# Patient Record
Sex: Female | Born: 1965 | Race: White | Hispanic: No | Marital: Married | Smoking: Never smoker
Health system: Southern US, Community
[De-identification: ages and names within clinical notes are randomized; demographics above are authoritative.]

## PROBLEM LIST (undated history)

## (undated) DIAGNOSIS — D649 Anemia, unspecified: Secondary | ICD-10-CM

## (undated) DIAGNOSIS — C801 Malignant (primary) neoplasm, unspecified: Secondary | ICD-10-CM

## (undated) DIAGNOSIS — L409 Psoriasis, unspecified: Secondary | ICD-10-CM

## (undated) DIAGNOSIS — M1990 Unspecified osteoarthritis, unspecified site: Secondary | ICD-10-CM

## (undated) HISTORY — PX: APPENDECTOMY: SHX54

---

## 1998-04-27 ENCOUNTER — Ambulatory Visit (HOSPITAL_COMMUNITY): Admission: RE | Admit: 1998-04-27 | Discharge: 1998-04-27 | Payer: Self-pay | Admitting: Obstetrics and Gynecology

## 1998-07-23 ENCOUNTER — Encounter (HOSPITAL_COMMUNITY): Admission: RE | Admit: 1998-07-23 | Discharge: 1998-07-27 | Payer: Self-pay | Admitting: Obstetrics and Gynecology

## 1998-07-26 ENCOUNTER — Inpatient Hospital Stay (HOSPITAL_COMMUNITY): Admission: AD | Admit: 1998-07-26 | Discharge: 1998-07-28 | Payer: Self-pay | Admitting: Obstetrics and Gynecology

## 1998-08-01 ENCOUNTER — Encounter (HOSPITAL_COMMUNITY): Admission: RE | Admit: 1998-08-01 | Discharge: 1998-10-30 | Payer: Self-pay | Admitting: Obstetrics and Gynecology

## 1999-09-13 ENCOUNTER — Ambulatory Visit (HOSPITAL_COMMUNITY): Admission: RE | Admit: 1999-09-13 | Discharge: 1999-09-13 | Payer: Self-pay | Admitting: Obstetrics and Gynecology

## 1999-09-13 ENCOUNTER — Encounter: Payer: Self-pay | Admitting: Obstetrics and Gynecology

## 1999-09-17 ENCOUNTER — Encounter: Payer: Self-pay | Admitting: *Deleted

## 1999-09-17 ENCOUNTER — Ambulatory Visit (HOSPITAL_COMMUNITY): Admission: RE | Admit: 1999-09-17 | Discharge: 1999-09-17 | Payer: Self-pay | Admitting: *Deleted

## 2000-02-10 ENCOUNTER — Inpatient Hospital Stay (HOSPITAL_COMMUNITY): Admission: AD | Admit: 2000-02-10 | Discharge: 2000-02-12 | Payer: Self-pay | Admitting: Obstetrics and Gynecology

## 2001-05-28 ENCOUNTER — Encounter (INDEPENDENT_AMBULATORY_CARE_PROVIDER_SITE_OTHER): Payer: Self-pay | Admitting: *Deleted

## 2001-05-28 ENCOUNTER — Encounter: Payer: Self-pay | Admitting: Emergency Medicine

## 2001-05-28 ENCOUNTER — Observation Stay (HOSPITAL_COMMUNITY): Admission: EM | Admit: 2001-05-28 | Discharge: 2001-05-29 | Payer: Self-pay | Admitting: Emergency Medicine

## 2003-05-02 ENCOUNTER — Encounter: Payer: Self-pay | Admitting: Obstetrics and Gynecology

## 2003-05-02 ENCOUNTER — Ambulatory Visit (HOSPITAL_COMMUNITY): Admission: RE | Admit: 2003-05-02 | Discharge: 2003-05-02 | Payer: Self-pay | Admitting: Obstetrics and Gynecology

## 2004-11-27 ENCOUNTER — Encounter: Admission: RE | Admit: 2004-11-27 | Discharge: 2004-12-26 | Payer: Self-pay | Admitting: Internal Medicine

## 2007-06-01 ENCOUNTER — Ambulatory Visit (HOSPITAL_COMMUNITY): Admission: RE | Admit: 2007-06-01 | Discharge: 2007-06-01 | Payer: Self-pay | Admitting: Obstetrics and Gynecology

## 2008-07-07 ENCOUNTER — Ambulatory Visit (HOSPITAL_COMMUNITY): Admission: RE | Admit: 2008-07-07 | Discharge: 2008-07-07 | Payer: Self-pay | Admitting: Obstetrics and Gynecology

## 2009-05-10 ENCOUNTER — Encounter: Admission: RE | Admit: 2009-05-10 | Discharge: 2009-05-10 | Payer: Self-pay | Admitting: Internal Medicine

## 2009-05-17 ENCOUNTER — Encounter: Admission: RE | Admit: 2009-05-17 | Discharge: 2009-05-17 | Payer: Self-pay | Admitting: Internal Medicine

## 2010-12-03 ENCOUNTER — Other Ambulatory Visit (HOSPITAL_COMMUNITY): Payer: Self-pay | Admitting: Obstetrics and Gynecology

## 2010-12-03 DIAGNOSIS — Z1231 Encounter for screening mammogram for malignant neoplasm of breast: Secondary | ICD-10-CM

## 2010-12-11 ENCOUNTER — Ambulatory Visit (HOSPITAL_COMMUNITY)
Admission: RE | Admit: 2010-12-11 | Discharge: 2010-12-11 | Disposition: A | Discharge status: Home / Self Care | Payer: 59 | Source: Ambulatory Visit | Attending: Obstetrics and Gynecology | Admitting: Obstetrics and Gynecology

## 2010-12-11 DIAGNOSIS — Z1231 Encounter for screening mammogram for malignant neoplasm of breast: Secondary | ICD-10-CM

## 2011-01-24 NOTE — Discharge Summary (Signed)
[facility]  Patient:    Makayla King, Makayla King                      MRN: [id number] Adm. Date:  [id number] Disc. Date: [id number] Attending:  Malon Kindle                           Discharge Summary  HISTORY OF PRESENT ILLNESS:   A 45 year old white married female, para 1-0-0-1,  gravida 2, last menstrual period April 28, 1999, [facility] Feb 02, 2000, by dates and February 12, 2000, by ultrasound, admitted in active labor.  Blood group and type O positive with a negative antibody, nonreactive serology, rubella positive, hepatitis B surface antigen negative, HIV not done, GC and Chlamydia negative, triple screen normal, Group B Strep positive, one-hour Glucola 127.  Vaginal ultrasound on July 19, 1999, crown-rump length 3.3 cm, 10 weeks 2 days, [facility]  February 12, 2000.  Repeat ultrasound September 13, 1999, average gestational age [redacted] weeks 6 days, [facility] February 08, 2000.  Nuchal fold was at the upper limit of normal at 5.9 o 6.0 mm.  There were two coroid plexus cysts.  Amnio was done, AFP was normal, chromosomes were 46XY.  The patient began contracting on the morning of admission and came for evaluation.  Cervix was found to be 8 to 9 cm in triage.  ALLERGIES:                    No known drug allergies.  PAST MEDICAL HISTORY:         No operations, usual childhood diseases. Alcohol, tobacco, and drugs none.  FAMILY HISTORY:               Father with high blood pressure and coronary artery disease.  PAST OBSTETRIC HISTORY:       In November of 1999, the patient delivered a 9 pound 3 ounce female vaginally with vacuum extraction.  PHYSICAL EXAMINATION:         VITAL SIGNS: On admission her vital signs were normal.  ABDOMEN: Soft.  Fundal height had been 38 cm on February 07, 2000, fetal heart tones were normal.  PELVIC: Cervix 8 to 9 cm, 100%, and vertex at -1 to 0 station.  HOSPITAL COURSE:              The patient was given IV antibiotics.  The nursing crew  in the maternity admissions unit began her on ampicillin, I would have chosen penicillin, but she did receive the ampicillin.  She requested an epidural. After the epidural, she progressed to full dilatation at 3:12 a.m.  Artificial rupture of membranes produced clear fluid.  During the second stage, meconium stained fluid became apparent.  The patient gradually progressed to showing a grapefuit-sized  caput.  Midline episiotomy done with the patients permission.  Produced delivery in one contraction after the patient had stalled for five contractions.  The infant was female, 9 pounds 5 ounces, Apgars 9 at one minute and 9 at five minutes. Nose and pharynx were suctioned with the DeLee and the bulb with the head on the perineum.  There was a loose nuchal cord.  Dagoberto Ligas, M.D. visualized he cords and no meconium was present.  The placenta was intact.  The uterus was normal.  Midline episiotomy repaired with 3-0 Dexon.  Estimated blood loss about 400 cc.  Postpartum, the patient did quite  well and was ready for discharge on he second postpartum day.  RPR was nonreactive.  Hemoglobin on admission 11.6, hematocrit 32.6, white count 10,100, and platelet count 174,000.  Follow-up hemoglobin 10.0, hematocrit 29.4, white count 9900, and platelet count 145,000.  FINAL DIAGNOSES:              1. Intrauterine pregnancy at 39+ weeks.                               2. Delivered right occipitoanterior.  PROCEDURE:                    Spontaneous vaginal delivery ROA, midline episiotomy and repair.  CONDITION ON DISCHARGE:       Improved.  DISCHARGE INSTRUCTIONS:       Include our regular discharge instruction booklet. She is given a prescription for Tylenol No.3 16 tablets one every four to six hours as needed for pain.  She is to return to the office in six weeks for follow-up examination. DD:  02/12/00 TD:  02/14/00 Job: [id number]

## 2011-01-24 NOTE — Op Note (Signed)
[facility] [place]  Patient:    Makayla King, Makayla King Visit Number: [id number] MRN: [id number]          Service Type: SUR Location: 3W [id number] 01 Attending Physician:  Vikki Ports Dictated by:   Vikki Ports, M.D. Proc. Date: 05/28/01 Admit Date:  05/28/2001                             Operative Report  PREOPERATIVE DIAGNOSIS:  Acute appendicitis.  POSTOPERATIVE DIAGNOSIS:  Acute appendicitis.  PROCEDURE:  Laparoscopic appendectomy.  ANESTHESIA:  General.  SURGEON:  Vikki Ports, M.D.  DESCRIPTION OF PROCEDURE:  The patient was taken to the operating room and placed in the supine position.  After adequate general anesthesia was induced, the abdomen was prepped and draped in the usual sterile fashion.  Using a transverse supraumbilical incision, I dissected down to fascia.  The fascia was opened vertically.  An 0 Vicryl pursestring suture was placed on the fascial defect.  Hasson trocar was placed in the abdomen and the abdomen was insufflated to 15 mmHg with continuous flow carbon dioxide.  The appendix was identified and appeared acutely inflamed.  There was no exudative fluid around it.  A 5 mm trocar was placed on the right abdomen and a blunt 10/11 was placed in the left suprapubic region under direct visualization.  The appendix was grasped and lifted anteriorly.  The mesoappendix was dissected free and divided using the vascular GIA stapling device.  The base of the appendix was easily visualized and divided using the GIA stapling device.  It was placed within the Endocatch bag and removed through the umbilical port.  The right lower quadrant was copiously irrigated.  The abdomen was allowed to deflate. The fascial defects were closed with 0 Vicryl pursestring sutures.  The skin incisions were closed with subcuticular 4-0 Monocryl.  Steri-Strips and sterile dressings were applied.  All incisions were injected using 0.5  Marcaine.  The patient tolerated the procedure well and went to PACU in good condition. Dictated by:   Vikki Ports, M.D. Attending Physician:  Danna Hefty R. DD:  05/28/01 TD:  05/29/01 Job: [id number]

## 2012-11-23 ENCOUNTER — Ambulatory Visit: Payer: 59 | Admitting: Sports Medicine

## 2012-11-30 ENCOUNTER — Ambulatory Visit (INDEPENDENT_AMBULATORY_CARE_PROVIDER_SITE_OTHER): Payer: 59 | Admitting: Sports Medicine

## 2012-11-30 VITALS — BP 100/60 | Ht 62.0 in | Wt 112.0 lb

## 2012-11-30 DIAGNOSIS — M25531 Pain in right wrist: Secondary | ICD-10-CM

## 2012-11-30 DIAGNOSIS — M25539 Pain in unspecified wrist: Secondary | ICD-10-CM

## 2012-11-30 NOTE — Progress Notes (Signed)
  Subjective:    Patient ID: Makayla King, female    DOB: Jun 17, 1966, 47 y.o.   MRN: [id number]  HPI  Pt presents to clinic for evaluation of rt wrist pain ulnar side x 10 months.  Plays tennis- hits a slice forehand with Eastern grip. Has tried taping, icing, and taking ibuprofen before tennis, but continues to have wrist pain. 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 Had cortisone injection at [facility] at the end of last summer. This did not make any difference in symptoms  The right wrist sometimes feels weak on ulnar deviation She sometimes gets night pain particularly after playing tennis or using her wrist a lot     Review of Systems     Objective:   Physical Exam Pleasant female in no acute distress  Rt wrist exam: 16.25 wrist circumference 80 deg extension 90 deg flexion 45 deg ulnar deviation 15 deg radial deviation Resisted wrist extension painful Resisted wrist flexion painful Resisted wrist extension with wrist at 45 deg not painful Ulnar and radial deviation with resistance not painful Grip squeeze painful  Rt elbow strength normal No sign impingement of rt shoulder  Lt wrist : 16 wrist circumference 80 deg extension 90 deg flexion 30 deg ulnar deviation 20 deg radial deviation     Assessment & Plan:

## 2012-11-30 NOTE — Assessment & Plan Note (Signed)
Ultrasound of right wrist Compartments 1 through 6 are visualized. All of these are normal. Scanning over the right triangular fibrocartilage reveals an area of hypoechoic change that compresses with movement of the wrist. There is also some hypoechoic change and that is located in the cartilage in the midportion the longitudinal scan.  There is not a discrete tear visualized with motions. There is no wrist effusion.  I suggested multiple modifications to try with her tennis racquet and also working with a tennis pro to change her biomechanics. We did give her a DonJoy wrist compression wrap   She will do home rehabilitation, try to make the changes we suggested in her racquet and her tennis form and then recheck with [place] in about 2 months.

## 2012-11-30 NOTE — Patient Instructions (Addendum)
Easy wrist exercises with 1 lb dumbell - up, down, side to side, rotations   and ball squeezes  You have triangular fibro cartilage tear in RT wrist (don't get alarmed on Internet)  Ice lateral wrist after playing  Try don Joy wrist splint but if not working enough order body helix  [email or web address]- full wrist helix  Work with [employer]- get racquet strung in mid 40s Test a light weight racquet with 4.25 grip size with soft over wrap Strings need to be light weight and low vibration Look at forehand grip and stroke to see if it can be changed to get less wrist deviation towards pinky finger Work on service motion - not to get a flat wrist snap  Try sleeping with pillow to support hand during sleep to help morning pain  Please follow up 2 months after you have made adjustments with tennis  Thank you for seeing Makayla King today!

## 2013-02-14 ENCOUNTER — Other Ambulatory Visit (HOSPITAL_COMMUNITY): Payer: Self-pay | Admitting: Obstetrics and Gynecology

## 2013-02-14 DIAGNOSIS — Z1231 Encounter for screening mammogram for malignant neoplasm of breast: Secondary | ICD-10-CM

## 2013-02-16 ENCOUNTER — Ambulatory Visit (HOSPITAL_COMMUNITY)
Admission: RE | Admit: 2013-02-16 | Discharge: 2013-02-16 | Disposition: A | Discharge status: Home / Self Care | Payer: 59 | Source: Ambulatory Visit | Attending: Obstetrics and Gynecology | Admitting: Obstetrics and Gynecology

## 2013-02-16 DIAGNOSIS — Z1231 Encounter for screening mammogram for malignant neoplasm of breast: Secondary | ICD-10-CM | POA: Insufficient documentation

## 2013-04-19 ENCOUNTER — Other Ambulatory Visit: Payer: Self-pay | Admitting: Obstetrics and Gynecology

## 2013-04-19 ENCOUNTER — Encounter (HOSPITAL_COMMUNITY): Payer: Self-pay | Admitting: Obstetrics and Gynecology

## 2013-04-25 ENCOUNTER — Encounter (HOSPITAL_COMMUNITY): Payer: Self-pay

## 2013-06-02 ENCOUNTER — Encounter (HOSPITAL_COMMUNITY): Payer: Self-pay | Admitting: *Deleted

## 2013-06-14 MED ORDER — PHENYLEPHRINE 40 MCG/ML (10ML) SYRINGE FOR IV PUSH (FOR BLOOD PRESSURE SUPPORT)
PREFILLED_SYRINGE | INTRAVENOUS | Status: AC
  Filled 2013-06-14: qty 5

## 2013-06-14 MED ORDER — MEPERIDINE HCL 25 MG/ML IJ SOLN
INTRAMUSCULAR | Status: AC
  Filled 2013-06-14: qty 1

## 2013-06-14 MED ORDER — OXYTOCIN 10 UNIT/ML IJ SOLN
INTRAMUSCULAR | Status: AC
  Filled 2013-06-14: qty 4

## 2013-06-14 MED ORDER — MORPHINE SULFATE 0.5 MG/ML IJ SOLN
INTRAMUSCULAR | Status: AC
  Filled 2013-06-14: qty 10

## 2013-06-14 MED ORDER — ONDANSETRON HCL 4 MG/2ML IJ SOLN
INTRAMUSCULAR | Status: AC
  Filled 2013-06-14: qty 2

## 2013-06-15 ENCOUNTER — Encounter (HOSPITAL_COMMUNITY)
Admission: RE | Disposition: A | Discharge status: Home / Self Care | Payer: Self-pay | Source: Ambulatory Visit | Attending: Obstetrics and Gynecology

## 2013-06-15 ENCOUNTER — Ambulatory Visit (HOSPITAL_COMMUNITY): Payer: 59 | Admitting: Registered Nurse

## 2013-06-15 ENCOUNTER — Ambulatory Visit (HOSPITAL_COMMUNITY)
Admission: RE | Admit: 2013-06-15 | Discharge: 2013-06-15 | Disposition: A | Discharge status: Home / Self Care | Payer: 59 | Source: Ambulatory Visit | Attending: Obstetrics and Gynecology | Admitting: Obstetrics and Gynecology

## 2013-06-15 ENCOUNTER — Encounter (HOSPITAL_COMMUNITY): Payer: 59 | Admitting: Registered Nurse

## 2013-06-15 ENCOUNTER — Encounter (HOSPITAL_COMMUNITY): Payer: Self-pay | Admitting: Certified Registered"

## 2013-06-15 DIAGNOSIS — N920 Excessive and frequent menstruation with regular cycle: Secondary | ICD-10-CM | POA: Insufficient documentation

## 2013-06-15 DIAGNOSIS — D649 Anemia, unspecified: Secondary | ICD-10-CM

## 2013-06-15 DIAGNOSIS — D500 Iron deficiency anemia secondary to blood loss (chronic): Secondary | ICD-10-CM | POA: Insufficient documentation

## 2013-06-15 HISTORY — PX: DILITATION & CURRETTAGE/HYSTROSCOPY WITH NOVASURE ABLATION: SHX5568

## 2013-06-15 HISTORY — DX: Anemia, unspecified: D64.9

## 2013-06-15 LAB — COMPREHENSIVE METABOLIC PANEL
AST: 18 U/L (ref 0–37)
Albumin: 4 g/dL (ref 3.5–5.2)
BUN: 13 mg/dL (ref 6–23)
CO2: 25 mEq/L (ref 19–32)
Calcium: 9.5 mg/dL (ref 8.4–10.5)
Chloride: 106 mEq/L (ref 96–112)
Creatinine, Ser: 0.86 mg/dL (ref 0.50–1.10)
GFR calc non Af Amer: 80 mL/min — ABNORMAL LOW (ref >90)
Glucose, Bld: 92 mg/dL (ref 70–99)
Total Protein: 6.8 g/dL (ref 6.0–8.3)

## 2013-06-15 LAB — CBC
HCT: 36.2 % (ref 36.0–46.0)
Hemoglobin: 11.9 g/dL — ABNORMAL LOW (ref 12.0–15.0)
RBC: 4.27 MIL/uL (ref 3.87–5.11)
WBC: 5.4 10*3/uL (ref 4.0–10.5)

## 2013-06-15 LAB — URINE MICROSCOPIC-ADD ON

## 2013-06-15 LAB — URINALYSIS, ROUTINE W REFLEX MICROSCOPIC
Glucose, UA: NEGATIVE mg/dL
Leukocytes, UA: NEGATIVE
Nitrite: NEGATIVE
Specific Gravity, Urine: 1.025 (ref 1.005–1.030)
pH: 6 (ref 5.0–8.0)

## 2013-06-15 LAB — PREGNANCY, URINE: Preg Test, Ur: NEGATIVE

## 2013-06-15 SURGERY — DILATATION & CURETTAGE/HYSTEROSCOPY WITH NOVASURE ABLATION
Anesthesia: General | Site: Vagina | Wound class: Clean Contaminated

## 2013-06-15 MED ORDER — KETOROLAC TROMETHAMINE 30 MG/ML IJ SOLN: 15.0000 mg | Freq: Once | INTRAMUSCULAR | Status: DC | PRN

## 2013-06-15 MED ORDER — PROMETHAZINE HCL 25 MG/ML IJ SOLN: 6.2500 mg | INTRAMUSCULAR | Status: DC | PRN

## 2013-06-15 MED ORDER — LIDOCAINE HCL 1 % IJ SOLN: INTRAMUSCULAR | Status: DC | PRN

## 2013-06-15 MED ORDER — MIDAZOLAM HCL 2 MG/2ML IJ SOLN
INTRAMUSCULAR | Status: AC
  Filled 2013-06-15: qty 2

## 2013-06-15 MED ORDER — LIDOCAINE HCL 1 % IJ SOLN
INTRAMUSCULAR | Status: AC
  Filled 2013-06-15: qty 20

## 2013-06-15 MED ORDER — FENTANYL CITRATE 0.05 MG/ML IJ SOLN
INTRAMUSCULAR | Status: AC
  Filled 2013-06-15: qty 5

## 2013-06-15 MED ORDER — ONDANSETRON HCL 4 MG/2ML IJ SOLN
INTRAMUSCULAR | Status: DC | PRN
  Administered 2013-06-15: 4 mg via INTRAMUSCULAR

## 2013-06-15 MED ORDER — DEXAMETHASONE SODIUM PHOSPHATE 10 MG/ML IJ SOLN
INTRAMUSCULAR | Status: AC
  Filled 2013-06-15: qty 1

## 2013-06-15 MED ORDER — IBUPROFEN 600 MG PO TABS: 600.0000 mg | ORAL_TABLET | Freq: Four times a day (QID) | ORAL | Status: DC | PRN

## 2013-06-15 MED ORDER — LACTATED RINGERS IV SOLN
INTRAVENOUS | Status: DC
  Administered 2013-06-15 (×2): via INTRAVENOUS

## 2013-06-15 MED ORDER — ONDANSETRON HCL 4 MG/2ML IJ SOLN
INTRAMUSCULAR | Status: AC
  Filled 2013-06-15: qty 2

## 2013-06-15 MED ORDER — PROPOFOL 10 MG/ML IV BOLUS
INTRAVENOUS | Status: DC | PRN
  Administered 2013-06-15: 180 mg via INTRAVENOUS

## 2013-06-15 MED ORDER — LACTATED RINGERS IV SOLN
INTRAVENOUS | Status: DC | PRN
  Administered 2013-06-15: 1000 mL via INTRAUTERINE

## 2013-06-15 MED ORDER — MIDAZOLAM HCL 2 MG/2ML IJ SOLN
INTRAMUSCULAR | Status: DC | PRN
  Administered 2013-06-15: 2 mg via INTRAVENOUS

## 2013-06-15 MED ORDER — DEXAMETHASONE SODIUM PHOSPHATE 10 MG/ML IJ SOLN
INTRAMUSCULAR | Status: DC | PRN
  Administered 2013-06-15: 10 mg via INTRAVENOUS

## 2013-06-15 MED ORDER — PROPOFOL 10 MG/ML IV EMUL
INTRAVENOUS | Status: AC
  Filled 2013-06-15: qty 20

## 2013-06-15 MED ORDER — KETOROLAC TROMETHAMINE 30 MG/ML IJ SOLN
INTRAMUSCULAR | Status: DC | PRN
  Administered 2013-06-15: 30 mg via INTRAVENOUS

## 2013-06-15 MED ORDER — LIDOCAINE HCL (CARDIAC) 20 MG/ML IV SOLN
INTRAVENOUS | Status: AC
  Filled 2013-06-15: qty 5

## 2013-06-15 MED ORDER — MIDAZOLAM HCL 2 MG/2ML IJ SOLN: 0.5000 mg | Freq: Once | INTRAMUSCULAR | Status: DC | PRN

## 2013-06-15 MED ORDER — MEPERIDINE HCL 25 MG/ML IJ SOLN: 6.2500 mg | INTRAMUSCULAR | Status: DC | PRN

## 2013-06-15 MED ORDER — FENTANYL CITRATE 0.05 MG/ML IJ SOLN: 25.0000 ug | INTRAMUSCULAR | Status: DC | PRN

## 2013-06-15 MED ORDER — FENTANYL CITRATE 0.05 MG/ML IJ SOLN
INTRAMUSCULAR | Status: DC | PRN
  Administered 2013-06-15: 25 ug via INTRAVENOUS
  Administered 2013-06-15: 50 ug via INTRAVENOUS

## 2013-06-15 MED ORDER — LIDOCAINE HCL (CARDIAC) 20 MG/ML IV SOLN
INTRAVENOUS | Status: DC | PRN
  Administered 2013-06-15: 80 mg via INTRAVENOUS

## 2013-06-15 SURGICAL SUPPLY — 16 items
ABLATOR ENDOMETRIAL BIPOLAR (ABLATOR) ×2 IMPLANT
CANISTER SUCTION 2500CC (MISCELLANEOUS) ×2 IMPLANT
CATH ROBINSON RED A/P 16FR (CATHETERS) ×2 IMPLANT
CLOTH BEACON ORANGE TIMEOUT ST (SAFETY) ×2 IMPLANT
CONTAINER PREFILL 10% NBF 60ML (FORM) ×4 IMPLANT
DRESSING TELFA 8X3 (GAUZE/BANDAGES/DRESSINGS) ×2 IMPLANT
ELECT REM PT RETURN 9FT ADLT (ELECTROSURGICAL)
ELECTRODE REM PT RTRN 9FT ADLT (ELECTROSURGICAL) IMPLANT
GLOVE BIO SURGEON STRL SZ7.5 (GLOVE) ×2 IMPLANT
GOWN PREVENTION PLUS XLARGE (GOWN DISPOSABLE) ×2 IMPLANT
GOWN STRL REIN XL XLG (GOWN DISPOSABLE) ×4 IMPLANT
PACK HYSTEROSCOPY LF (CUSTOM PROCEDURE TRAY) ×2 IMPLANT
PAD OB MATERNITY 4.3X12.25 (PERSONAL CARE ITEMS) ×2 IMPLANT
SUT CHROMIC 2 0 SH (SUTURE) ×2 IMPLANT
TOWEL OR 17X24 6PK STRL BLUE (TOWEL DISPOSABLE) ×4 IMPLANT
WATER STERILE IRR 1000ML POUR (IV SOLUTION) ×2 IMPLANT

## 2013-06-15 NOTE — Progress Notes (Signed)
Patient ID: Makayla King, female   DOB: Jul 07, 1966, 47 y.o.   MRN: [id number] I examined this pt 06-14-13 and she reports no change in her health since that time

## 2013-06-15 NOTE — Discharge Summary (Signed)
Op note:  D&C , HYSTEROSCOPY AND NOVASURE ABLATION  aNESTHESIA : gENERAL  Operator : Marek Nghiem   No known complications

## 2013-06-15 NOTE — Anesthesia Preprocedure Evaluation (Addendum)
Anesthesia Evaluation  Patient identified by MRN, date of birth, ID band Patient awake    Reviewed: Allergy & Precautions, H&P , Patient's Chart, lab work & pertinent test results, reviewed documented beta blocker date and time   History of Anesthesia Complications Negative for: history of anesthetic complications  Airway Mallampati: II TM Distance: >3 FB Neck ROM: full    Dental no notable dental hx.    Pulmonary neg pulmonary ROS,  breath sounds clear to auscultation  Pulmonary exam normal       Cardiovascular Exercise Tolerance: Good negative cardio ROS  Rhythm:regular Rate:Normal     Neuro/Psych negative neurological ROS  negative psych ROS   GI/Hepatic negative GI ROS, Neg liver ROS,   Endo/Other  negative endocrine ROS  Renal/GU negative Renal ROS     Musculoskeletal   Abdominal   Peds  Hematology negative hematology ROS (+) anemia ,   Anesthesia Other Findings   Reproductive/Obstetrics negative OB ROS                           Anesthesia Physical Anesthesia Plan  ASA: II  Anesthesia Plan: General LMA   Post-op Pain Management:    Induction:   Airway Management Planned:   Additional Equipment:   Intra-op Plan:   Post-operative Plan:   Informed Consent: I have reviewed the patients History and Physical, chart, labs and discussed the procedure including the risks, benefits and alternatives for the proposed anesthesia with the patient or authorized representative who has indicated his/her understanding and acceptance.   Dental Advisory Given  Plan Discussed with: CRNA, Surgeon and Anesthesiologist  Anesthesia Plan Comments:         Anesthesia Quick Evaluation

## 2013-06-15 NOTE — Transfer of Care (Signed)
Immediate Anesthesia Transfer of Care Note  Patient: Makayla King  Procedure(s) Performed: Procedure(s): DILATATION & CURETTAGE/HYSTEROSCOPY WITH NOVASURE ABLATION (N/A)  Patient Location: PACU  Anesthesia Type:General  Level of Consciousness: awake, alert  and oriented  Airway & Oxygen Therapy: Patient Spontanous Breathing  Post-op Assessment: Report given to PACU RN and Post -op Vital signs reviewed and stable  Post vital signs: Reviewed and stable  Complications: No apparent anesthesia complications

## 2013-06-15 NOTE — Preoperative (Signed)
Beta Blockers   Reason not to administer Beta Blockers:Not Applicable 

## 2013-06-15 NOTE — Anesthesia Postprocedure Evaluation (Signed)
  Anesthesia Post-op Note  Patient: Makayla King  Procedure(s) Performed: Procedure(s): DILATATION & CURETTAGE/HYSTEROSCOPY WITH NOVASURE ABLATION (N/A) Patient is awake and responsive. Pain and nausea are reasonably well controlled. Vital signs are stable and clinically acceptable. Oxygen saturation is clinically acceptable. There are no apparent anesthetic complications at this time. Patient is ready for discharge.

## 2013-06-15 NOTE — H&P (Signed)
NAMEBLAINE, Makayla King               ACCOUNT NO.:  [id number]  MEDICAL RECORD NO.:  [id number]  LOCATION:                                 FACILITY:  PHYSICIAN:  Malachi Pro. Ambrose Mantle, M.D. DATE OF BIRTH:  Mar 17, 1966  DATE OF ADMISSION:  06/15/2013 DATE OF DISCHARGE:                             HISTORY & PHYSICAL   HISTORY OF PRESENT ILLNESS:  This is a 47 year old white female, who is admitted to the hospital because of severe menorrhagia, history of significant anemia.  Then a desire for endometrial ablation.  Her last period was on May 20, 2013, it lasted 10 days, previous period about a month before, lasted 10 days.  She states that her periods usually last up to 10 days on the heaviest days, she uses 20 pads a day. She states that the periods seriously impact on her life.  When she was seen in July 2014, she had she had these complaints.  She underwent a CBC showed a hemoglobin of 9.1.  Endometrial biopsy that was benign. Ultrasound that suggested multiple small leiomyoma or possible adenomyosis.  The patient does not have any menstrual cramping or dysmenorrhea.  PAST MEDICAL HISTORY:  No known allergies.  She does not take medications.  Medical history is insignificant except for anemia in 2014.  SURGICAL HISTORY:  She has had an appendectomy.  SOCIAL HISTORY:  She never smoked.  Drinks occasionally.  Does not use illicit drugs.  FAMILY HISTORY:  Her father died from heart problems.  Her mother is 15, living and well.  One brother and a sister apparently living and well.  PHYSICAL EXAMINATION:  VITAL SIGNS:  Blood pressure is 100/68, height 5 feet 2 inches, weight 117 pounds, pulse is 70, BMI 21.4. HEAD EYES NOSE AND THROAT:  Normal. NECK:  Supple without thyromegaly. HEART:  Normal size and sounds.  No murmurs. LUNGS:  Clear to auscultation. ABDOMEN:  Soft, nontender.  No masses are palpable. GU:  Vulva and vagina are clean.  The cervix is clean.  The uterus  is anterior, normal size.  Adnexa free of masses.  ADMITTING IMPRESSION:  Menorrhagia with history of significant anemia. No dysmenorrhea.  The patient requests NovaSure ablation.  She will undergo a D and C and hysteroscopy, and NovaSure ablation.  She has been counseled about the risks of surgery and is ready to proceed.     Malachi Pro. Ambrose Mantle, M.D.     TFH/MEDQ  D:  06/14/2013  T:  06/14/2013  Job:  [id number]

## 2013-06-16 ENCOUNTER — Encounter (HOSPITAL_COMMUNITY): Payer: Self-pay | Admitting: Obstetrics and Gynecology

## 2013-06-16 NOTE — Op Note (Signed)
Makayla King, Makayla King               ACCOUNT NO.:  [id number]  MEDICAL RECORD NO.:  [id number]  LOCATION:  [facility]                          FACILITY:  [facility]  PHYSICIAN:  Malachi Pro. Ambrose Mantle, M.D. DATE OF BIRTH:  [birth year]  DATE OF PROCEDURE:  06/15/2013 DATE OF DISCHARGE:  06/15/2013                              OPERATIVE REPORT   PREOPERATIVE DIAGNOSES:  Severe menorrhagia and anemia.  POSTOPERATIVE DIAGNOSES:  Severe menorrhagia and anemia.  OPERATION:  D and C, hysteroscopy, and NovaSure ablation.  OPERATOR:  Malachi Pro. Ambrose Mantle, M.D.  ANESTHESIA:  General anesthesia.  DESCRIPTION OF PROCEDURE:  I asked the representative for NovaSure to be in the operating room with me.  The patient was brought to the operating room, placed under satisfactory general anesthesia and placed in lithotomy position in [place] stirrups.  Exam revealed the uterus to be anterior and normal size.  The adnexa were free of masses.  The vulva, vagina, perineum and urethra were prepped with Betadine solution, and the bladder was emptied with a [place] catheter.  Sterile draping was done.  A time-out was done.  The cervix was exposed, grasped with a tenaculum on the anterior cervix and the uterus sounded to 8.5 cm. Initially, I had called it 7.5 cm, but unfortunately, the sound I used had a structure where 8 cm was located and 8 was not on the sound.  So, the hysteroscope was introduced after dilating the cervix.  The hysteroscope was introduced, the entire cavity was normal except for what appeared to be an avascular flat polyp almost clear in nature on the upper posterior surface of the uterus close to the fundus.  I used a polyp forceps to see if I could evacuate the what I thought might be a small polyp and it was successful in removing small amounts of tissue. I then did an endocervical curettage followed by an endometrial curettage.  I had already done a biopsy in the office.  I looked again with the  hysteroscope, but the avascular structure that seemed almost clear in color and was flat, had not been removed.  I had obtained a good sample of the entire endometrial cavity.  However, so I proceeded with the NovaSure.  The best I could tell the accurate measurements were 8.5-cm sounding length, 4.0 cervical length and 4.5 cm cavity length. Cavity width was 3.3 cm.  The NovaSure was activated and the machine stayed activated for 1 minute and 55 seconds.  The NovaSure was removed. The patient tolerated the procedure well.  There was bleeding from the right side of the cervix from the tenaculum site and I sutured it with a 2-0 chromic suture.  After that, the bleeding was absent and the patient was returned to recovery.    Malachi Pro. Ambrose Mantle, M.D.    TFH/MEDQ  D:  06/15/2013  T:  06/16/2013  Job:  [id number]

## 2015-03-16 ENCOUNTER — Other Ambulatory Visit: Payer: Self-pay | Admitting: Physician Assistant

## 2018-08-04 DIAGNOSIS — M1712 Unilateral primary osteoarthritis, left knee: Secondary | ICD-10-CM | POA: Diagnosis not present

## 2018-08-04 DIAGNOSIS — M25562 Pain in left knee: Secondary | ICD-10-CM | POA: Diagnosis not present

## 2018-09-13 ENCOUNTER — Encounter: Payer: Self-pay | Admitting: Family Medicine

## 2018-09-13 ENCOUNTER — Ambulatory Visit: Payer: 59 | Admitting: Family Medicine

## 2018-09-13 VITALS — BP 108/70 | HR 78 | Temp 97.7°F | Ht 62.0 in | Wt 115.4 lb

## 2018-09-13 DIAGNOSIS — M2550 Pain in unspecified joint: Secondary | ICD-10-CM

## 2018-09-13 DIAGNOSIS — Z1211 Encounter for screening for malignant neoplasm of colon: Secondary | ICD-10-CM

## 2018-09-13 DIAGNOSIS — R5383 Other fatigue: Secondary | ICD-10-CM | POA: Diagnosis not present

## 2018-09-13 DIAGNOSIS — Z1239 Encounter for other screening for malignant neoplasm of breast: Secondary | ICD-10-CM | POA: Diagnosis not present

## 2018-09-13 DIAGNOSIS — L409 Psoriasis, unspecified: Secondary | ICD-10-CM

## 2018-09-13 DIAGNOSIS — N644 Mastodynia: Secondary | ICD-10-CM | POA: Diagnosis not present

## 2018-09-13 DIAGNOSIS — Z862 Personal history of diseases of the blood and blood-forming organs and certain disorders involving the immune mechanism: Secondary | ICD-10-CM

## 2018-09-13 DIAGNOSIS — M25562 Pain in left knee: Secondary | ICD-10-CM | POA: Diagnosis not present

## 2018-09-13 DIAGNOSIS — G8929 Other chronic pain: Secondary | ICD-10-CM

## 2018-09-13 DIAGNOSIS — Z13220 Encounter for screening for lipoid disorders: Secondary | ICD-10-CM | POA: Diagnosis not present

## 2018-09-13 DIAGNOSIS — D229 Melanocytic nevi, unspecified: Secondary | ICD-10-CM

## 2018-09-13 LAB — COMPREHENSIVE METABOLIC PANEL
ALT: 20 U/L (ref 0–35)
AST: 17 U/L (ref 0–37)
Albumin: 4.7 g/dL (ref 3.5–5.2)
Alkaline Phosphatase: 55 U/L (ref 39–117)
BUN: 15 mg/dL (ref 6–23)
CO2: 24 mEq/L (ref 19–32)
Calcium: 9.8 mg/dL (ref 8.4–10.5)
Chloride: 102 mEq/L (ref 96–112)
Creatinine, Ser: 0.75 mg/dL (ref 0.40–1.20)
GFR: 86.29 mL/min (ref >60.00)
Glucose, Bld: 69 mg/dL — ABNORMAL LOW (ref 70–99)
POTASSIUM: 4.4 meq/L (ref 3.5–5.1)
Sodium: 138 mEq/L (ref 135–145)
Total Bilirubin: 0.7 mg/dL (ref 0.2–1.2)
Total Protein: 7.2 g/dL (ref 6.0–8.3)

## 2018-09-13 LAB — CBC WITH DIFFERENTIAL/PLATELET
Basophils Absolute: 0 10*3/uL (ref 0.0–0.1)
Basophils Relative: 0.7 % (ref 0.0–3.0)
Eosinophils Absolute: 0.1 10*3/uL (ref 0.0–0.7)
Eosinophils Relative: 1.3 % (ref 0.0–5.0)
HCT: 42.8 % (ref 36.0–46.0)
Hemoglobin: 14.8 g/dL (ref 12.0–15.0)
LYMPHS PCT: 25.2 % (ref 12.0–46.0)
Lymphs Abs: 1.1 10*3/uL (ref 0.7–4.0)
MCHC: 34.6 g/dL (ref 30.0–36.0)
MCV: 89.3 fl (ref 78.0–100.0)
MONOS PCT: 5.8 % (ref 3.0–12.0)
Monocytes Absolute: 0.3 10*3/uL (ref 0.1–1.0)
Neutro Abs: 2.9 10*3/uL (ref 1.4–7.7)
Neutrophils Relative %: 67 % (ref 43.0–77.0)
Platelets: 230 10*3/uL (ref 150.0–400.0)
RBC: 4.8 Mil/uL (ref 3.87–5.11)
RDW: 13.4 % (ref 11.5–15.5)
WBC: 4.4 10*3/uL (ref 4.0–10.5)

## 2018-09-13 LAB — LIPID PANEL
Cholesterol: 268 mg/dL — ABNORMAL HIGH (ref 0–200)
HDL: 86.2 mg/dL (ref >39.00)
LDL Cholesterol: 170 mg/dL — ABNORMAL HIGH (ref 0–99)
NonHDL: 182.27
Total CHOL/HDL Ratio: 3
Triglycerides: 62 mg/dL (ref 0.0–149.0)
VLDL: 12.4 mg/dL (ref 0.0–40.0)

## 2018-09-13 LAB — TSH: TSH: 3.13 u[IU]/mL (ref 0.35–4.50)

## 2018-09-13 LAB — C-REACTIVE PROTEIN: CRP: 0.2 mg/dL — ABNORMAL LOW (ref 0.5–20.0)

## 2018-09-13 MED ORDER — FLUOCINOLONE ACETONIDE 0.01 % EX SOLN: Freq: Two times a day (BID) | CUTANEOUS | 2 refills | Status: DC

## 2018-09-13 NOTE — Patient Instructions (Signed)
Why is Exercise Important? If I told you I had a single pill that would help you decrease stress by improving anxiety, decreasing depression, help you achieve a healthy weight, give you more energy, make you more productive, help you focus, decrease your risk of dementia/heart attack/stroke/falls, improve your bone health, and more would you be interested? These are just some of the benefits that exercise brings to you. IT IS WORTH carving out some time every day to fit in exercise. It will help in every aspect of your health. Even if you have injuries that prevent you from participating in a type of exercise you used to do; there is always something that you can do to keep exercise a part of your life. If improving your health is important, make exercise your priority. It is worth the time! If you have questions about the type of exercise that is right for you, please talk with me about this!     Exercising to Stay Healthy  Exercising regularly is important. It has many health benefits, such as:  Improving your overall fitness, flexibility, and endurance.  Increasing your bone density.  Helping with weight control.  Decreasing your body fat.  Increasing your muscle strength.  Reducing stress and tension.  Improving your overall health.   In order to become healthy and stay healthy, it is recommended that you do moderate-intensity and vigorous-intensity exercise. You can tell that you are exercising at a moderate intensity if you have a higher heart rate and faster breathing, but you are still able to hold a conversation. You can tell that you are exercising at a vigorous intensity if you are breathing much harder and faster and cannot hold a conversation while exercising. How often should I exercise? Choose an activity that you enjoy and set realistic goals. Your health care provider can help you to make an activity plan that works for you. Exercise regularly as directed by your health care  provider. This may include:  Doing resistance training twice each week, such as: ? Push-ups. ? Sit-ups. ? Lifting weights. ? Using resistance bands.  Doing a given intensity of exercise for a given amount of time. Choose from these options: ? 150 minutes of moderate-intensity exercise every week. ? 75 minutes of vigorous-intensity exercise every week. ? A mix of moderate-intensity and vigorous-intensity exercise every week.   Children, pregnant women, people who are out of shape, people who are overweight, and older adults may need to consult a health care provider for individual recommendations. If you have any sort of medical condition, be sure to consult your health care provider before starting a new exercise program. What are some exercise ideas? Some moderate-intensity exercise ideas include:  Walking at a rate of 1 mile in 15 minutes.  Biking.  Hiking.  Golfing.  Dancing.   Some vigorous-intensity exercise ideas include:  Walking at a rate of at least 4.5 miles per hour.  Jogging or running at a rate of 5 miles per hour.  Biking at a rate of at least 10 miles per hour.  Lap swimming.  Roller-skating or in-line skating.  Cross-country skiing.  Vigorous competitive sports, such as football, basketball, and soccer.  Jumping rope.  Aerobic dancing.   What are some everyday activities that can help me to get exercise?  Yard work, such as: ? Pushing a lawn mower. ? Raking and bagging leaves.  Washing and waxing your car.  Pushing a stroller.  Shoveling snow.  Gardening.  Washing windows or   floors. How can I be more active in my day-to-day activities?  Use the stairs instead of the elevator.  Take a walk during your lunch break.  If you drive, park your car farther away from work or school.  If you take public transportation, get off one stop early and walk the rest of the way.  Make all of your phone calls while standing up and walking  around.  Get up, stretch, and walk around every 30 minutes throughout the day. What guidelines should I follow while exercising?  Do not exercise so much that you hurt yourself, feel dizzy, or get very short of breath.  Consult your health care provider before starting a new exercise program.  Wear comfortable clothes and shoes with good support.  Drink plenty of water while you exercise to prevent dehydration or heat stroke. Body water is lost during exercise and must be replaced.  Work out until you breathe faster and your heart beats faster. This information is not intended to replace advice given to you by your health care provider. Make sure you discuss any questions you have with your health care provider.  

## 2018-09-13 NOTE — Progress Notes (Addendum)
Makayla King DOB: 1965-09-19 Encounter date: 09/13/2018  This is a 53 y.o. female who presents to establish care. Chief Complaint  Patient presents with  . New Patient (Initial Visit)    History of present illness: Has some knee pain. Plays tennis and walks regularly. Thought just overuse - left knee. Then walking on sand at beach and then started with pain going up and down steps. Got cortisone shot before Thanksgiving. Pain is gone, but gets a little swollen and "spongy" feeling. If she walks more than 20 minutes will swell. No other joints that bother her. No known injury to knee.   Has eczema on scalp - has tried all the tgel, shampoo, etc. Chunky, scaly skin on lower back of head. Itches, scabs over. Has been there on and off for years. Used to have it on palms of hands, but now just more on scalp. Worse in last 6 months.   Stinging sensation left breast - has woken her from sleep sometime. Not sure what it is. Not external; but noted more in last 3 months. Not consistent. Feels like it is right behind nipple. Lasts for maybe 20 minutes. Just sensation and then gone.   Past Medical History:  Diagnosis Date  . Anemia   . SVD (spontaneous vaginal delivery)    x 2   Past Surgical History:  Procedure Laterality Date  . APPENDECTOMY     2001  . DILITATION & CURRETTAGE/HYSTROSCOPY WITH NOVASURE ABLATION N/A 06/15/2013   Procedure: DILATATION & CURETTAGE/HYSTEROSCOPY WITH NOVASURE ABLATION;  Surgeon: Melina Schools, MD;  Location: [facility];  Service: Gynecology;  Laterality: N/A;   No Known Allergies Current Meds  Medication Sig  . ibuprofen (ADVIL,MOTRIN) 600 MG tablet Take 1 tablet (600 mg total) by mouth every 6 (six) hours as needed for pain.  . Multiple Vitamins-Minerals (MULTI VITAMIN/MINERALS) TABS Take 1 tablet by mouth 2 (two) times daily. One Source  . Omega-3 Fatty Acids (FISH OIL OMEGA-3 PO) Take 1,200 mg by mouth.   Social History   Tobacco Use  . Smoking status:  Never Smoker  . Smokeless tobacco: Never Used  Substance Use Topics  . Alcohol use: Yes    Comment: socially   Family History  Problem Relation Age of Onset  . High blood pressure Mother   . High blood pressure Father   . Colon cancer Father        uncertain details  . High blood pressure Sister   . Breast cancer Maternal Grandmother 80  . Diabetes Maternal Grandfather   . Heart disease Maternal Grandfather   . CAD Maternal Grandfather   . Multiple sclerosis Paternal Grandmother   . Emphysema Paternal Grandfather        smoker     Review of Systems  Constitutional: Negative for chills, fatigue and fever.  Respiratory: Negative for cough, chest tightness, shortness of breath and wheezing.   Cardiovascular: Negative for chest pain, palpitations and leg swelling.  Musculoskeletal: Positive for arthralgias.  Skin: Positive for rash (see hpi).    Objective:  BP 108/70 (BP Location: Left Arm, Patient Position: Sitting, Cuff Size: Normal)   Pulse 78   Temp 97.7 F (36.5 C) (Oral)   Ht 5\' 2"  (1.575 m)   Wt 115 lb 6.4 oz (52.3 kg)   SpO2 99%   BMI 21.11 kg/m   Weight: 115 lb 6.4 oz (52.3 kg)   BP Readings from Last 3 Encounters:  09/13/18 108/70  06/15/13 128/66  11/30/12 100/60  Wt Readings from Last 3 Encounters:  09/13/18 115 lb 6.4 oz (52.3 kg)  06/02/13 114 lb (51.7 kg)  11/30/12 112 lb (50.8 kg)    Physical Exam Constitutional:      General: She is not in acute distress.    Appearance: She is well-developed.  Cardiovascular:     Rate and Rhythm: Normal rate and regular rhythm.     Heart sounds: Normal heart sounds. No murmur. No friction rub.     Comments: No lower extremity edema Pulmonary:     Effort: Pulmonary effort is normal. No respiratory distress.     Breath sounds: Normal breath sounds. No wheezing or rales.  Chest:     Breasts: Breasts are symmetrical.        Right: Normal. No inverted nipple, mass, nipple discharge, skin change or  tenderness.        Left: Normal. No inverted nipple, mass, nipple discharge, skin change or tenderness.  Musculoskeletal:     Comments: There is mild crepitance bilateral knees.  Otherwise knee exam is stable and without obvious abnormality.  Lymphadenopathy:     Upper Body:     Right upper body: No supraclavicular, axillary or pectoral adenopathy.     Left upper body: No supraclavicular, axillary or pectoral adenopathy.  Skin:    Comments: There is erythematous patch (approx 4cm in diameter) central posterior occiput.  There is some scaling of this patch.  Scalp in general is very dry.  Neurological:     Mental Status: She is alert and oriented to person, place, and time.  Psychiatric:        Behavior: Behavior normal.     Assessment/Plan: 1. Screening for breast cancer - MM Digital Screening Unilat R; Future  2. Pain from breast implant, initial encounter - MM Digital Diagnostic Unilat L; Future  3. Colon cancer screening - Ambulatory referral to Gastroenterology  4. Chronic pain of left knee Discussed options; can certainly return to ortho for evaluation but is willing to try some physical therapy along with supplements (discussed turmeric, glucosamine, tart cherry) and keep up with activity level. Discussed that daily use of joints is helpful for keeping arthritis symptoms controlled. - Ambulatory referral to Physical Therapy  5. History of anemia Will recheck baseline.  6. Lipid screening  - Lipid panel; Future - Lipid panel  7. Other fatigue  - CBC with Differential/Platelet; Future - Comprehensive metabolic panel; Future - TSH; Future - TSH - Comprehensive metabolic panel - CBC with Differential/Platelet  8. Arthralgia, unspecified joint  - ANA; Future - C-reactive protein; Future - C-reactive protein - ANA  9. Psoriasis of scalp  - fluocinolone (SYNALAR) 0.01 % external solution; Apply topically 2 (two) times daily.  Dispense: 60 mL; Refill: 2  10.  Skin moles - refer to derm for routine checks due to numerous freckles/moles.  Return in about 3 months (around 12/13/2018) for physical exam.  Micheline Rough, MD

## 2018-09-13 NOTE — Addendum Note (Signed)
Addended by: Caren Macadam on: 09/13/2018 03:06 PM   Modules accepted: Orders

## 2018-09-14 LAB — ANA: Anti Nuclear Antibody(ANA): NEGATIVE

## 2018-09-16 ENCOUNTER — Encounter: Payer: Self-pay | Admitting: Physical Therapy

## 2018-09-16 ENCOUNTER — Other Ambulatory Visit: Payer: Self-pay

## 2018-09-16 ENCOUNTER — Ambulatory Visit: Payer: 59 | Attending: Family Medicine | Admitting: Physical Therapy

## 2018-09-16 DIAGNOSIS — M25562 Pain in left knee: Secondary | ICD-10-CM | POA: Insufficient documentation

## 2018-09-16 DIAGNOSIS — M6281 Muscle weakness (generalized): Secondary | ICD-10-CM | POA: Diagnosis not present

## 2018-09-16 DIAGNOSIS — M25662 Stiffness of left knee, not elsewhere classified: Secondary | ICD-10-CM | POA: Insufficient documentation

## 2018-09-16 DIAGNOSIS — G8929 Other chronic pain: Secondary | ICD-10-CM | POA: Insufficient documentation

## 2018-09-16 NOTE — Patient Instructions (Signed)
Access Code: [email or web address]  URL: [email or web address]  Date: 09/16/2018  Prepared by: Ruben Im   Exercises  Clamshell - 15 reps - 1 sets - 1x daily - 7x weekly  Seated Knee Extension AROM - 10 reps - 1 sets - 6x daily - 7x weekly  Isometric Gluteus Medius at Wall - 10 reps - 1 sets - 1x daily - 7x weekly

## 2018-09-16 NOTE — Therapy (Signed)
[facility] [address], [place], [place], [address] Phone: [phone]   Fax:  [phone]  Physical Therapy Evaluation  Patient Details  Name: Makayla King MRN: [id number] Date of Birth: August 31, 1966 Referring Provider (PT): Micheline Rough   Encounter Date: 09/16/2018  PT End of Session - 09/16/18 2002    Visit Number  1    Date for PT Re-Evaluation  11/11/18    Authorization Type  U[employer]     PT Start Time  0830    PT Stop Time  0925    PT Time Calculation (min)  55 min    Activity Tolerance  Patient tolerated treatment well       Past Medical History:  Diagnosis Date  . Anemia   . SVD (spontaneous vaginal delivery)    x 2    Past Surgical History:  Procedure Laterality Date  . APPENDECTOMY     2001  . DILITATION & CURRETTAGE/HYSTROSCOPY WITH NOVASURE ABLATION N/A 06/15/2013   Procedure: DILATATION & CURETTAGE/HYSTEROSCOPY WITH NOVASURE ABLATION;  Surgeon: Melina Schools, MD;  Location: [facility];  Service: Gynecology;  Laterality: N/A;    There were no vitals filed for this visit.   Subjective Assessment - 09/16/18 0834    Subjective  Left knee pain in late summer which worsened at the beach in October.  Hurt going up and down steps initially.  Advil, ice, rest.  Had cortisone shot before Thanksgiving which helped some.  Tennis and walking worsens.  Spongy feeling.  Lateral knee swelling and inferior knee pain.      Limitations  Walking;House hold activities    How long can you walk comfortably?  close to 3 miles    Diagnostic tests  OA "cartilage is shot"      Patient Stated Goals  for it all to go away ;  return to previous level of activity     Currently in Pain?  Yes    Pain Score  0-No pain    Pain Location  Knee    Pain Orientation  Left    Pain Onset  More than a month ago    Pain Frequency  Intermittent    Aggravating Factors   walking; after effects of activity     Pain Relieving  Factors  ice, ibuprofen          [facility] PT Assessment - 09/16/18 0001      Assessment   Medical Diagnosis  chronic pain of left knee    Referring Provider (PT)  Ethlyn Gallery, [place]    Onset Date/Surgical Date  --   summer   Next MD Visit  as needed    Prior Therapy  wrist      Precautions   Precautions  None      Restrictions   Weight Bearing Restrictions  No      Balance Screen   Has the patient fallen in the past 6 months  No    Has the patient had a decrease in activity level because of a fear of falling?   No    Is the patient reluctant to leave their home because of a fear of falling?   No      Home Social worker  Private residence    Home Access  Stairs to enter    [place]  Two level      Prior Function   Level of [place]  Full time employment    Vocation Requirements  sit at desk    Leisure  play tennis;  walking;ex      Observation/Other Assessments   Focus on Therapeutic Outcomes (FOTO)   36% limitation       Posture/Postural Control   Posture Comments  tendency to hyperextend knees in standing      AROM   Overall AROM Comments  hip WFLs    Right Knee Extension  0    Right Knee Flexion  140    Left Knee Extension  0    Left Knee Flexion  135      Strength   Overall Strength  --   knee adduction/internal rotation and trunk lean w/ stepdowns   Overall Strength Comments  pelvic drop with single leg standing     Right Hip Flexion  5/5    Right Hip ABduction  4+/5    Left Hip Flexion  5/5    Left Hip Extension  5/5    Left Hip ABduction  4/5    Right Knee Flexion  5/5    Right Knee Extension  5/5    Left Knee Flexion  5/5    Left Knee Extension  4/5      Flexibility   Soft Tissue Assessment /Muscle Length  yes    Hamstrings  WFL slight limitation left > right       Palpation   Patella mobility  WFLs increased lateral pull with quad set      Special Tests   Other special tests  able to do 3/4  squat no pain but "spongy" feeling       Ambulation/Gait   Gait Comments  WFLs                Objective measurements completed on examination: See above findings.              PT Education - 09/16/18 0921    Education Details   Access Code: [email or web address] clams, standing glute med stand tall ex;  frequent knee ROM several times/day     Person(s) Educated  Patient    Methods  Demonstration;Explanation;Handout    Comprehension  Returned demonstration;Verbalized understanding       PT Short Term Goals - 09/16/18 2025      PT SHORT TERM GOAL #1   Title  The patient will have knowledge of basic self care techniques to control pain, swelling and for ROM ex's    Time  4    Period  Weeks    Status  New    Target Date  10/14/18      PT SHORT TERM GOAL #2   Title  The patient will report a 30% reduction in knee pain with walking and playing tennis    Time  4    Period  Weeks    Status  New      PT SHORT TERM GOAL #3   Title  The patient will have 140 degrees of left knee flexion needed for squatting and playing tennis    Time  4    Period  Weeks    Status  New        PT Long Term Goals - 09/16/18 2027      PT LONG TERM GOAL #1   Title  The patient will be independent in safe self progression of HEP    Time  8    Period  Weeks    Status  New    Target Date  11/11/18      PT LONG TERM GOAL #2   Title  The patient will report a 60% improvement in knee pain with walking and playing tennis    Time  8    Period  Weeks    Status  New      PT LONG TERM GOAL #3   Title  The patient will have 4+/5 quad, gluteal and trunk/core strength needed for running and playing tennis    Time  8    Period  Weeks    Status  New      PT LONG TERM GOAL #4   Title  FOTO functional outcome score improved from 36% limitation to 27% indicating improved function with less pain    Time  8    Period  Weeks    Status  New             Plan - 09/16/18 2003    Clinical  Impression Statement  Left knee pain in late summer which worsened at the beach in October with lots of walking on the beach and going up/down steps.  She reports her pain has improved since a knee injection around Thanksgiving but she continues to have lateral knee pain with long distance walking and playing tennis.  Slightly limited knee flexion ROM and left HS length.  Left knee extension and hip abduction strength decreased 4/5.  Left pelvic drop and trunk lean noted with single leg standing and step down test indicating gluteus medius weakness and trunk/core weakness.  Tendency for knee hyperextension in standing and increased patellar lateral pull noted.  No swelling noted in this early morning appt but may be present at the end of the day.  She would benefit from PT to address these limitations.      History and Personal Factors relevant to plan of care:  no co-morbidities    Clinical Presentation  Stable    Clinical Decision Making  Low    Rehab Potential  Good    PT Frequency  1x / week   pt request to only do 1x/week for financial reasons   PT Duration  8 weeks    PT Treatment/Interventions  Iontophoresis 4mg /ml Dexamethasone;Cryotherapy;Electrical Stimulation;Ultrasound;Moist Heat;Therapeutic activities;Therapeutic exercise;Neuromuscular re-education;Patient/family education;Manual techniques;Dry needling;Taping;Vasopneumatic Device    PT Next Visit Plan  ionto if cert signed by MD;  Rozann Lesches taping;  add band to clams;  glute med strengthening; quad strengthening step ups/downs with patellofemoral control ;  ITB stretching and possible DN as needed     PT Home Exercise Plan  Access Code: [email or web address]     Consulted and Agree with Plan of Care  Patient       Patient will benefit from skilled therapeutic intervention in order to improve the following deficits and impairments:  Pain, Decreased range of motion, Decreased strength, Impaired perceived functional ability, Difficulty walking  Visit  Diagnosis: Chronic pain of left knee - Plan: PT plan of care cert/re-cert  Muscle weakness (generalized) - Plan: PT plan of care cert/re-cert  Stiffness of left knee, not elsewhere classified - Plan: PT plan of care cert/re-cert     Problem List Patient Active Problem List   Diagnosis Date Noted  . Left knee pain 09/13/2018  . Psoriasis of scalp 09/13/2018   Ruben Im, PT 09/16/18 8:34 PM Phone: [phone] Fax: [phone] Alvera Singh 09/16/2018, 8:34 PM  [facility] [address], [address] [place],  [place], [address] Phone: [phone]   Fax:  [phone]  Name: Makayla King MRN: [id number] Date of Birth: 1966/04/07

## 2018-09-23 ENCOUNTER — Encounter: Payer: Self-pay | Admitting: Gastroenterology

## 2018-09-23 ENCOUNTER — Encounter: Payer: Self-pay | Admitting: Physical Therapy

## 2018-09-23 ENCOUNTER — Ambulatory Visit: Payer: 59 | Admitting: Physical Therapy

## 2018-09-23 DIAGNOSIS — M6281 Muscle weakness (generalized): Secondary | ICD-10-CM

## 2018-09-23 DIAGNOSIS — M25562 Pain in left knee: Secondary | ICD-10-CM | POA: Diagnosis not present

## 2018-09-23 DIAGNOSIS — M25662 Stiffness of left knee, not elsewhere classified: Secondary | ICD-10-CM

## 2018-09-23 DIAGNOSIS — G8929 Other chronic pain: Secondary | ICD-10-CM

## 2018-09-23 NOTE — Therapy (Signed)
[facility] [address] [place], [place], [address] Phone: [phone]   Fax:  [phone]  Physical Therapy Treatment  Patient Details  Name: Makayla King MRN: [id number] Date of Birth: 1966/08/05 Referring Provider (PT): Micheline Rough   Encounter Date: 09/23/2018  PT End of Session - 09/23/18 0933    Visit Number  2    Date for PT Re-Evaluation  11/11/18    Authorization Type  UHC     PT Start Time  0840    PT Stop Time  0925    PT Time Calculation (min)  45 min    Activity Tolerance  Patient tolerated treatment well       Past Medical History:  Diagnosis Date  . Anemia   . SVD (spontaneous vaginal delivery)    x 2    Past Surgical History:  Procedure Laterality Date  . APPENDECTOMY     2001  . DILITATION & CURRETTAGE/HYSTROSCOPY WITH NOVASURE ABLATION N/A 06/15/2013   Procedure: DILATATION & CURETTAGE/HYSTEROSCOPY WITH NOVASURE ABLATION;  Surgeon: Melina Schools, MD;  Location: [facility];  Service: Gynecology;  Laterality: N/A;    There were no vitals filed for this visit.  Subjective Assessment - 09/23/18 0837    Subjective  I'm discouraged.  It's always swollen lateral knee.  Bigger than my other knee after playing tennis.  I haven't been walking b/c I'm afraid it will hurt after 2 miles.      Currently in Pain?  No/denies    Pain Score  0-No pain    Pain Orientation  Left                       [facility] Adult PT Treatment/Exercise - 09/23/18 0001      Knee/Hip Exercises: Aerobic   Nustep  L4 5 min seat 6      Knee/Hip Exercises: Standing   Lateral Step Up  Left;10 reps;Step Height: 4"    Lateral Step Up Limitations  holding at the top with hips level, avoiding knee hyperextension    Step Down  Left;2 sets;10 reps;Hand Hold: 0;Step Height: 4"    Step Down Limitations  stepping down and then back with mirror     Other Standing Knee Exercises  green band lateral step and  back in crouched position and sidestepping 15x each    Other Standing Knee Exercises  green band around thighs with hip extension 10x right/left       Knee/Hip Exercises: Sidelying   Clams  with green band left 15x    Other Sidelying Knee/Hip Exercises  side planks on elbow and knee with top leg hip abduction 7x      Iontophoresis   Type of Iontophoresis  Dexamethasone    Location  left lateral knee    Dose  4 mg/ml    Time  4-6 hour patch              PT Education - 09/23/18 0927    Education Details   Access Code: [email or web address] side planks, lateral step up and holds; step down and back;  side step with band; crouched hip extension with band around thighs; ionto instructions    Person(s) Educated  Patient    Methods  Explanation;Handout;Demonstration    Comprehension  Verbalized understanding;Returned demonstration       PT Short Term Goals - 09/16/18 2025      PT SHORT TERM GOAL #1   Title  The patient will have knowledge of basic self care techniques to control pain, swelling and for ROM ex's    Time  4    Period  Weeks    Status  New    Target Date  10/14/18      PT SHORT TERM GOAL #2   Title  The patient will report a 30% reduction in knee pain with walking and playing tennis    Time  4    Period  Weeks    Status  New      PT SHORT TERM GOAL #3   Title  The patient will have 140 degrees of left knee flexion needed for squatting and playing tennis    Time  4    Period  Weeks    Status  New        PT Long Term Goals - 09/16/18 2027      PT LONG TERM GOAL #1   Title  The patient will be independent in safe self progression of HEP    Time  8    Period  Weeks    Status  New    Target Date  11/11/18      PT LONG TERM GOAL #2   Title  The patient will report a 60% improvement in knee pain with walking and playing tennis    Time  8    Period  Weeks    Status  New      PT LONG TERM GOAL #3   Title  The patient will have 4+/5 quad, gluteal and trunk/core  strength needed for running and playing tennis    Time  8    Period  Weeks    Status  New      PT LONG TERM GOAL #4   Title  FOTO functional outcome score improved from 36% limitation to 27% indicating improved function with less pain    Time  8    Period  Weeks    Status  New            Plan - 09/23/18 9326    Clinical Impression Statement  The patient is able to perform a progression of knee exercises including glute medius strengthening needed for patellofemoral alignment.  Verbal cues needed for alignment to avoid excessive knee adduction/internal rotation and to avoid excessive knee hyperextension.   No complaints of knee pain.   Very minimal lateral knee swelling today but patient reports this will increase with activity.      Rehab Potential  Good    PT Frequency  1x / week    PT Duration  8 weeks    PT Treatment/Interventions  Iontophoresis 4mg /ml Dexamethasone;Cryotherapy;Electrical Stimulation;Ultrasound;Moist Heat;Therapeutic activities;Therapeutic exercise;Neuromuscular re-education;Patient/family education;Manual techniques;Dry needling;Taping;Vasopneumatic Device    PT Next Visit Plan  assess response to ionto and continue;  patellofemoral alignment;  glute medius strengthening;  review HEP and progress ;  add HS stretch including medial and lateral biases;  may be a candidate for taping (either KT for swelling or McConnell patellar)     PT Home Exercise Plan  Access Code: [email or web address]        Patient will benefit from skilled therapeutic intervention in order to improve the following deficits and impairments:  Pain, Decreased range of motion, Decreased strength, Impaired perceived functional ability, Difficulty walking  Visit Diagnosis: Chronic pain of left knee  Muscle weakness (generalized)  Stiffness of left knee, not elsewhere classified     Problem List Patient Active Problem  List   Diagnosis Date Noted  . Left knee pain 09/13/2018  . Psoriasis of scalp  09/13/2018   Ruben Im, PT 09/23/18 9:42 AM Phone: [phone] Fax: [phone]  Alvera Singh 09/23/2018, 9:41 AM  [facility] [address] [place], [place], [address] Phone: [phone]   Fax:  [phone]  Name: Makayla King MRN: [id number] Date of Birth: Mar 29, 1966

## 2018-09-23 NOTE — Patient Instructions (Signed)
Access Code: [email or web address]  URL: [email or web address]  Date: 09/23/2018  Prepared by: Ruben Im   Exercises  Clamshell - 15 reps - 1 sets - 1x daily - 7x weekly  Seated Knee Extension AROM - 10 reps - 1 sets - 6x daily - 7x weekly  Isometric Gluteus Medius at Wall - 10 reps - 1 sets - 1x daily - 7x weekly  Modified Side Plank with Hip Abduction - 10 reps - 1 sets - 1x daily - 7x weekly  Lateral Step Up - 10 reps - 1 sets - 1x daily - 7x weekly  Forward Step Down - 10 reps - 1 sets - 1x daily - 7x weekly  Backward Step Down - 10 reps - 1 sets - 1x daily - 7x weekly  Side Stepping with Resistance at Thighs - 10 reps - 1 sets - 1x daily - 7x weekly  Diagonal Alternating Hip Extension with Resistance - 10 reps - 1 sets - 1x daily - 7x weekly     IONTOPHORESIS PATIENT PRECAUTIONS & CONTRAINDICATIONS:  . Redness under one or both electrodes can occur.  This characterized by a uniform redness that usually disappears within 12 hours of treatment. . Small pinhead size blisters may result in response to the drug.  Contact your physician if the problem persists more than 24 hours. . On rare occasions, iontophoresis therapy can result in temporary skin reactions such as rash, inflammation, irritation or burns.  The skin reactions may be the result of individual sensitivity to the ionic solution used, the condition of the skin at the start of treatment, reaction to the materials in the electrodes, allergies or sensitivity to dexamethasone, or a poor connection between the patch and your skin.  Discontinue using iontophoresis if you have any of these reactions and report to your therapist. . Remove the Patch or electrodes if you have any undue sensation of pain or burning during the treatment and report discomfort to your therapist. . Tell your Therapist if you have had known adverse reactions to the application of electrical current. . If using the Patch, the LED light will turn off when  treatment is complete and the patch can be removed.  Approximate treatment time is 1-3 hours.  Remove the patch when light goes off or after 6 hours. . The Patch can be worn during normal activity, however excessive motion where the electrodes have been placed can cause poor contact between the skin and the electrode or uneven electrical current resulting in greater risk of skin irritation. Marland Kitchen Keep out of the reach of children.   . DO NOT use if you have a cardiac pacemaker or any other electrically sensitive implanted device. . DO NOT use if you have a known sensitivity to dexamethasone. . DO NOT use during Magnetic Resonance Imaging (MRI). . DO NOT use over broken or compromised skin (e.g. sunburn, cuts, or acne) due to the increased risk of skin reaction. . DO NOT SHAVE over the area to be treated:  To establish good contact between the Patch and the skin, excessive hair may be clipped. . DO NOT place the Patch or electrodes on or over your eyes, directly over your heart, or brain. . DO NOT reuse the Patch or electrodes as this may cause burns to occur.

## 2018-09-28 ENCOUNTER — Other Ambulatory Visit: Payer: Self-pay | Admitting: Family Medicine

## 2018-09-28 DIAGNOSIS — N644 Mastodynia: Secondary | ICD-10-CM

## 2018-09-30 ENCOUNTER — Ambulatory Visit: Payer: 59

## 2018-09-30 DIAGNOSIS — M25562 Pain in left knee: Secondary | ICD-10-CM | POA: Diagnosis not present

## 2018-09-30 DIAGNOSIS — M6281 Muscle weakness (generalized): Secondary | ICD-10-CM

## 2018-09-30 DIAGNOSIS — G8929 Other chronic pain: Secondary | ICD-10-CM

## 2018-09-30 DIAGNOSIS — M25662 Stiffness of left knee, not elsewhere classified: Secondary | ICD-10-CM

## 2018-09-30 NOTE — Patient Instructions (Addendum)
Access Code: [email or web address]  URL: [email or web address]  Date: 09/30/2018  Prepared by: Sigurd Sos   Exercises   Seated Hamstring Stretch - 3 reps - 20 hold - 3x daily - 7x weekly Seated Figure 4 Piriformis Stretch - 3 reps - 20 hold - 3x daily - 7x weekly  IONTOPHORESIS PATIENT PRECAUTIONS & CONTRAINDICATIONS:  . Redness under one or both electrodes can occur.  This characterized by a uniform redness that usually disappears within 12 hours of treatment. . Small pinhead size blisters may result in response to the drug.  Contact your physician if the problem persists more than 24 hours. . On rare occasions, iontophoresis therapy can result in temporary skin reactions such as rash, inflammation, irritation or burns.  The skin reactions may be the result of individual sensitivity to the ionic solution used, the condition of the skin at the start of treatment, reaction to the materials in the electrodes, allergies or sensitivity to dexamethasone, or a poor connection between the patch and your skin.  Discontinue using iontophoresis if you have any of these reactions and report to your therapist. . Remove the Patch or electrodes if you have any undue sensation of pain or burning during the treatment and report discomfort to your therapist. . Tell your Therapist if you have had known adverse reactions to the application of electrical current. . If using the Patch, the LED light will turn off when treatment is complete and the patch can be removed.  Approximate treatment time is 1-3 hours.  Remove the patch when light goes off or after 6 hours. . The Patch can be worn during normal activity, however excessive motion where the electrodes have been placed can cause poor contact between the skin and the electrode or uneven electrical current resulting in greater risk of skin irritation. Marland Kitchen Keep out of the reach of children.   . DO NOT use if you have a cardiac pacemaker or any other  electrically sensitive implanted device. . DO NOT use if you have a known sensitivity to dexamethasone. . DO NOT use during Magnetic Resonance Imaging (MRI). . DO NOT use over broken or compromised skin (e.g. sunburn, cuts, or acne) due to the increased risk of skin reaction. . DO NOT SHAVE over the area to be treated:  To establish good contact between the Patch and the skin, excessive hair may be clipped. . DO NOT place the Patch or electrodes on or over your eyes, directly over your heart, or brain. . DO NOT reuse the Patch or electrodes as this may cause burns to occur.  [facility] [address] [place], [place] [address] Phone # [phone] Fax [phone]

## 2018-09-30 NOTE — Therapy (Signed)
[facility] [address], [place], [place], [address] Phone: [phone]   Fax:  [phone]  Physical Therapy Treatment  Patient Details  Name: Makayla King MRN: [id number] Date of Birth: 1966-09-06 Referring Provider (PT): Micheline Rough   Encounter Date: 09/30/2018  PT End of Session - 09/30/18 0933    Visit Number  3    Date for PT Re-Evaluation  11/11/18    Authorization Type  UHC     PT Start Time  0844    PT Stop Time  0932    PT Time Calculation (min)  48 min    Activity Tolerance  Patient tolerated treatment well    Behavior During Therapy  [facility] for tasks assessed/performed       Past Medical History:  Diagnosis Date  . Anemia   . SVD (spontaneous vaginal delivery)    x 2    Past Surgical History:  Procedure Laterality Date  . APPENDECTOMY     2001  . DILITATION & CURRETTAGE/HYSTROSCOPY WITH NOVASURE ABLATION N/A 06/15/2013   Procedure: DILATATION & CURETTAGE/HYSTEROSCOPY WITH NOVASURE ABLATION;  Surgeon: Melina Schools, MD;  Location: [facility];  Service: Gynecology;  Laterality: N/A;    There were no vitals filed for this visit.  Subjective Assessment - 09/30/18 0859    Subjective  I played tennis on Tuesday and my Lt knee felt about the same as it has felt.      Patient Stated Goals  for it all to go away ;  return to previous level of activity     Currently in Pain?  No/denies                       [facility] Adult PT Treatment/Exercise - 09/30/18 0001      Knee/Hip Exercises: Stretches   Active Hamstring Stretch  Left;3 reps;20 seconds    Piriformis Stretch  Both;3 reps;20 seconds      Knee/Hip Exercises: Aerobic   Nustep  L4 5 min seat 6      Knee/Hip Exercises: Standing   Lateral Step Up  Left;10 reps;Step Height: 4"    Lateral Step Up Limitations  holding at the top with hips level, avoiding knee hyperextension    Step Down  Left;2 sets;10 reps;Hand Hold:  0;Step Height: 4"    Step Down Limitations  stepping down and then back with mirror     SLS  on green pod with ball toss 3x10 on Lt    Walking with Sports Cord  25# 4 ways x 10 each    Other Standing Knee Exercises  green band lateral step and back in crouched position and sidestepping 15x each    Other Standing Knee Exercises  green band around thighs with hip extension 10x right/left       Iontophoresis   Type of Iontophoresis  Dexamethasone    Location  left lateral knee    Dose  4 mg/ml- 1cc   #2   Time  4-6 hour patch              PT Education - 09/30/18 0924    Education Details  Access Code: [email or web address]. ionto info    Person(s) Educated  Patient    Methods  Explanation;Demonstration;Handout    Comprehension  Returned demonstration;Verbalized understanding       PT Short Term Goals - 09/16/18 2025      PT SHORT TERM GOAL #1   Title  The patient will have knowledge of basic self care techniques to control pain, swelling and for ROM ex's    Time  4    Period  Weeks    Status  New    Target Date  10/14/18      PT SHORT TERM GOAL #2   Title  The patient will report a 30% reduction in knee pain with walking and playing tennis    Time  4    Period  Weeks    Status  New      PT SHORT TERM GOAL #3   Title  The patient will have 140 degrees of left knee flexion needed for squatting and playing tennis    Time  4    Period  Weeks    Status  New        PT Long Term Goals - 09/16/18 2027      PT LONG TERM GOAL #1   Title  The patient will be independent in safe self progression of HEP    Time  8    Period  Weeks    Status  New    Target Date  11/11/18      PT LONG TERM GOAL #2   Title  The patient will report a 60% improvement in knee pain with walking and playing tennis    Time  8    Period  Weeks    Status  New      PT LONG TERM GOAL #3   Title  The patient will have 4+/5 quad, gluteal and trunk/core strength needed for running and playing tennis    Time   8    Period  Weeks    Status  New      PT LONG TERM GOAL #4   Title  FOTO functional outcome score improved from 36% limitation to 27% indicating improved function with less pain    Time  8    Period  Weeks    Status  New            Plan - 09/30/18 0913    Clinical Impression Statement  The patient is able to perform a progression of knee exercises including glute medius strengthening and knee stability needed for neutral knee alignment.  Verbal cues needed for alignment to avoid excessive knee adduction/internal rotation and to avoid excessive knee hyperextension.   Pt denies any Lt knee pain with activity.  Pt will continue to benefit from skilled PT for Lt knee stability and strength.      Rehab Potential  Good    PT Frequency  1x / week    PT Duration  8 weeks    PT Treatment/Interventions  Iontophoresis 4mg /ml Dexamethasone;Cryotherapy;Electrical Stimulation;Ultrasound;Moist Heat;Therapeutic activities;Therapeutic exercise;Neuromuscular re-education;Patient/family education;Manual techniques;Dry needling;Taping;Vasopneumatic Device    PT Next Visit Plan  conitnue ionto, review hamstring stretches, conitnue stability and hip strength    PT Home Exercise Plan  Access Code: [email or web address]     Recommended Other Services  initial cert is signed    Consulted and Agree with Plan of Care  Patient       Patient will benefit from skilled therapeutic intervention in order to improve the following deficits and impairments:  Pain, Decreased range of motion, Decreased strength, Impaired perceived functional ability, Difficulty walking  Visit Diagnosis: Chronic pain of left knee  Muscle weakness (generalized)  Stiffness of left knee, not elsewhere classified     Problem List Patient Active Problem List   Diagnosis  Date Noted  . Left knee pain 09/13/2018  . 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, [place], [place], [address] Phone: [phone]   Fax:  [phone]  Name: Makayla King MRN: [id number] Date of Birth: 16-Jan-1966

## 2018-10-04 ENCOUNTER — Ambulatory Visit
Admission: RE | Admit: 2018-10-04 | Discharge: 2018-10-04 | Disposition: A | Discharge status: Home / Self Care | Payer: 59 | Source: Ambulatory Visit | Attending: Family Medicine | Admitting: Family Medicine

## 2018-10-04 DIAGNOSIS — N644 Mastodynia: Secondary | ICD-10-CM | POA: Diagnosis not present

## 2018-10-04 DIAGNOSIS — R922 Inconclusive mammogram: Secondary | ICD-10-CM | POA: Diagnosis not present

## 2018-10-07 ENCOUNTER — Ambulatory Visit: Payer: 59

## 2018-10-07 DIAGNOSIS — M6281 Muscle weakness (generalized): Secondary | ICD-10-CM

## 2018-10-07 DIAGNOSIS — M25662 Stiffness of left knee, not elsewhere classified: Secondary | ICD-10-CM

## 2018-10-07 DIAGNOSIS — M25562 Pain in left knee: Secondary | ICD-10-CM | POA: Diagnosis not present

## 2018-10-07 DIAGNOSIS — G8929 Other chronic pain: Secondary | ICD-10-CM

## 2018-10-07 NOTE — Therapy (Signed)
[facility] [address] [place], [place], [address] Phone: [phone]   Fax:  [phone]  Physical Therapy Treatment  Patient Details  Name: Makayla King MRN: [id number] Date of Birth: 1965/09/20 Referring Provider (PT): Micheline Rough   Encounter Date: 10/07/2018  PT End of Session - 10/07/18 0834    Visit Number  4    Date for PT Re-Evaluation  11/11/18    Authorization Type  UHC     PT Start Time  0802    PT Stop Time  0843    PT Time Calculation (min)  41 min    Activity Tolerance  Patient tolerated treatment well    Behavior During Therapy  [facility] for tasks assessed/performed       Past Medical History:  Diagnosis Date  . Anemia   . SVD (spontaneous vaginal delivery)    x 2    Past Surgical History:  Procedure Laterality Date  . APPENDECTOMY     2001  . DILITATION & CURRETTAGE/HYSTROSCOPY WITH NOVASURE ABLATION N/A 06/15/2013   Procedure: DILATATION & CURETTAGE/HYSTEROSCOPY WITH NOVASURE ABLATION;  Surgeon: Melina Schools, MD;  Location: [facility];  Service: Gynecology;  Laterality: N/A;    There were no vitals filed for this visit.  Subjective Assessment - 10/07/18 0808    Subjective  I am feeling discouraged.  I had more pain this week than last.  I didn't play tennis.      Patient Stated Goals  for it all to go away ;  return to previous level of activity     Currently in Pain?  Yes    Pain Score  --   up to 3/10 with squatting, standing   Pain Location  Knee    Pain Orientation  Left    Pain Descriptors / Indicators  Shooting    Pain Onset  More than a month ago    Pain Frequency  Intermittent    Aggravating Factors   squatting, certain positions    Pain Relieving Factors  ice, rest                       [facility] Adult PT Treatment/Exercise - 10/07/18 0001      Knee/Hip Exercises: Stretches   Active Hamstring Stretch  Left;3 reps;20 seconds    Piriformis Stretch   Both;3 reps;20 seconds      Knee/Hip Exercises: Aerobic   Nustep  L4 5 min seat 6      Knee/Hip Exercises: Standing   SLS  on green pod with ball toss 3x10 on Lt    Walking with Sports Cord  25# 4 ways x 10 each      Knee/Hip Exercises: Seated   Sit to Sand  20 reps;without UE support   with ball squeeze, then 20 reps with red band hip abduction     Knee/Hip Exercises: Supine   Bridges with [employer]  Strengthening;Both;2 sets;10 reps      Knee/Hip Exercises: Sidelying   Clams  with green band left 15x      Iontophoresis   Type of Iontophoresis  Dexamethasone    Location  left lateral knee    Dose  4 mg/ml- 1cc   #3   Time  4-6 hour patch              PT Education - 10/07/18 0834    Education Details  ionto info    Person(s) Educated  Patient  Methods  Explanation;Handout    Comprehension  Verbalized understanding       PT Short Term Goals - 09/16/18 2025      PT SHORT TERM GOAL #1   Title  The patient will have knowledge of basic self care techniques to control pain, swelling and for ROM ex's    Time  4    Period  Weeks    Status  New    Target Date  10/14/18      PT SHORT TERM GOAL #2   Title  The patient will report a 30% reduction in knee pain with walking and playing tennis    Time  4    Period  Weeks    Status  New      PT SHORT TERM GOAL #3   Title  The patient will have 140 degrees of left knee flexion needed for squatting and playing tennis    Time  4    Period  Weeks    Status  New        PT Long Term Goals - 09/16/18 2027      PT LONG TERM GOAL #1   Title  The patient will be independent in safe self progression of HEP    Time  8    Period  Weeks    Status  New    Target Date  11/11/18      PT LONG TERM GOAL #2   Title  The patient will report a 60% improvement in knee pain with walking and playing tennis    Time  8    Period  Weeks    Status  New      PT LONG TERM GOAL #3   Title  The patient will have 4+/5 quad,  gluteal and trunk/core strength needed for running and playing tennis    Time  8    Period  Weeks    Status  New      PT LONG TERM GOAL #4   Title  FOTO functional outcome score improved from 36% limitation to 27% indicating improved function with less pain    Time  8    Period  Weeks    Status  New            Plan - 10/07/18 0818    Clinical Impression Statement  Pt is discouraged due to continued Lt knee pain this week.  PT provided education regarding importance of strength and stability of knee and hip to for endurance and moderation of activity to reduce pain.  Pt performed hip and knee strength and stability exercises in the clinic and required minor verbal cues for alignment.  Pt will continue to benefit from skilled PT for Lt knee strength, ionto and flexibility to address Lt knee pain.      Rehab Potential  Good    PT Frequency  1x / week    PT Duration  8 weeks    PT Treatment/Interventions  Iontophoresis 4mg /ml Dexamethasone;Cryotherapy;Electrical Stimulation;Ultrasound;Moist Heat;Therapeutic activities;Therapeutic exercise;Neuromuscular re-education;Patient/family education;Manual techniques;Dry needling;Taping;Vasopneumatic Device    PT Next Visit Plan  conitnue ionto, review hamstring stretches, conitnue stability and hip strength    PT Home Exercise Plan  Access Code: [email or web address]     Consulted and Agree with Plan of Care  Patient       Patient will benefit from skilled therapeutic intervention in order to improve the following deficits and impairments:  Pain, Decreased range of motion, Decreased strength, Impaired perceived functional  ability, Difficulty walking  Visit Diagnosis: Chronic pain of left knee  Muscle weakness (generalized)  Stiffness of left knee, not elsewhere classified     Problem List Patient Active Problem List   Diagnosis Date Noted  . Left knee pain 09/13/2018  . 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 [place], [place], [address] Phone: [phone]   Fax:  [phone]  Name: Makayla King MRN: [id number] Date of Birth: 1966/03/26

## 2018-10-07 NOTE — Patient Instructions (Signed)

## 2018-10-12 ENCOUNTER — Ambulatory Visit (AMBULATORY_SURGERY_CENTER): Payer: Self-pay

## 2018-10-12 VITALS — Ht 62.0 in | Wt 118.2 lb

## 2018-10-12 DIAGNOSIS — Z1211 Encounter for screening for malignant neoplasm of colon: Secondary | ICD-10-CM

## 2018-10-12 MED ORDER — NA SULFATE-K SULFATE-MG SULF 17.5-3.13-1.6 GM/177ML PO SOLN: 1.0000 | Freq: Once | ORAL | 0 refills | Status: AC

## 2018-10-12 NOTE — Progress Notes (Signed)
Denies allergies to eggs or soy products. Denies complication of anesthesia or sedation. Denies use of weight loss medication. Denies use of O2.   Emmi instructions declined.    A pay no more than 50.00 coupon was given to the patient.  

## 2018-10-13 ENCOUNTER — Encounter: Payer: Self-pay | Admitting: Gastroenterology

## 2018-10-14 ENCOUNTER — Ambulatory Visit: Payer: 59 | Attending: Family Medicine

## 2018-10-14 DIAGNOSIS — M25562 Pain in left knee: Secondary | ICD-10-CM | POA: Diagnosis not present

## 2018-10-14 DIAGNOSIS — M25662 Stiffness of left knee, not elsewhere classified: Secondary | ICD-10-CM | POA: Diagnosis present

## 2018-10-14 DIAGNOSIS — M6281 Muscle weakness (generalized): Secondary | ICD-10-CM | POA: Diagnosis not present

## 2018-10-14 DIAGNOSIS — G8929 Other chronic pain: Secondary | ICD-10-CM | POA: Insufficient documentation

## 2018-10-14 NOTE — Therapy (Signed)
[facility] [address] [place], [place], [address] Phone: [phone]   Fax:  [phone]  Physical Therapy Treatment  Patient Details  Name: Makayla King MRN: [id number] Date of Birth: 11-Jan-1966 Referring Provider (PT): Micheline Rough   Encounter Date: 10/14/2018  PT End of Session - 10/14/18 0930    Visit Number  5    Date for PT Re-Evaluation  11/11/18    Authorization Type  UHC     PT Start Time  0847    PT Stop Time  0932    PT Time Calculation (min)  45 min    Activity Tolerance  Patient tolerated treatment well    Behavior During Therapy  [facility] for tasks assessed/performed       Past Medical History:  Diagnosis Date  . Anemia   . SVD (spontaneous vaginal delivery)    x 2    Past Surgical History:  Procedure Laterality Date  . APPENDECTOMY     2001  . DILITATION & CURRETTAGE/HYSTROSCOPY WITH NOVASURE ABLATION N/A 06/15/2013   Procedure: DILATATION & CURETTAGE/HYSTEROSCOPY WITH NOVASURE ABLATION;  Surgeon: Melina Schools, MD;  Location: [facility];  Service: Gynecology;  Laterality: N/A;    There were no vitals filed for this visit.  Subjective Assessment - 10/14/18 0855    Subjective  I am more swelling today than I had last week.  I played tennis twice this week.      Currently in Pain?  Yes    Pain Score  --   up to 3/10    Pain Location  Knee    Pain Orientation  Left    Pain Descriptors / Indicators  Shooting    Pain Type  Chronic pain    Pain Onset  More than a month ago    Pain Frequency  Intermittent    Aggravating Factors   squatting, after playing tennis    Pain Relieving Factors  ice, rest         [facility] PT Assessment - 10/14/18 0001      AROM   Right Knee Extension  0    Left Knee Extension  0    Left Knee Flexion  145      Strength   Right Hip ABduction  4+/5    Left Hip Extension  5/5    Left Hip ABduction  4+/5    Left Knee Extension  4+/5                    [facility] Adult PT Treatment/Exercise - 10/14/18 0001      Knee/Hip Exercises: Stretches   Active Hamstring Stretch  Left;3 reps;20 seconds      Knee/Hip Exercises: Aerobic   Stationary Bike  Level 2x 6 minutes      Knee/Hip Exercises: Standing   SLS  on blue pod with ball toss 3x10 on Lt    Walking with Sports Cord  25# 4 ways x 10 each      Knee/Hip Exercises: Supine   Bridges with [employer]  Strengthening;Both;2 sets;10 reps    Straight Leg Raises  Strengthening;Both;2 sets;10 reps      Knee/Hip Exercises: Sidelying   Clams  with blue band 2x10 bil.      Iontophoresis   Type of Iontophoresis  Dexamethasone    Location  left lateral knee    Dose  4 mg/ml- 1cc   #4   Time  4-6 hour patch  PT Short Term Goals - 10/14/18 0859      PT SHORT TERM GOAL #2   Title  The patient will report a 30% reduction in knee pain with walking and playing tennis    Baseline  25%     Time  4    Period  Weeks    Status  On-going      PT SHORT TERM GOAL #3   Title  The patient will have 140 degrees of left knee flexion needed for squatting and playing tennis    Baseline  145 degrees    Status  Achieved        PT Long Term Goals - 10/14/18 0858      PT LONG TERM GOAL #2   Title  The patient will report a 60% improvement in knee pain with walking and playing tennis    Baseline  25%    Time  8    Period  Weeks    Status  On-going            Plan - 10/14/18 0906    Clinical Impression Statement  Pt reports that she continues to have Lt knee pain and edema after playing tennis.  Pt reports 25% less edema that occurs later in the tennis match now.  Pt has had the same pain levels regardless of is she plays tennis or not.  Pt with 145 degrees of Lt knee flexion.  Lt hip flexion 4+/5 and abduction 4+/5 today.  PT continues to focus on strength, stability and endurance with correction for alignment.      Rehab Potential  Good    PT  Frequency  1x / week    PT Duration  8 weeks    PT Treatment/Interventions  Iontophoresis 4mg /ml Dexamethasone;Cryotherapy;Electrical Stimulation;Ultrasound;Moist Heat;Therapeutic activities;Therapeutic exercise;Neuromuscular re-education;Patient/family education;Manual techniques;Dry needling;Taping;Vasopneumatic Device    PT Next Visit Plan  conitnue ionto, conitnue stability and hip strength    PT Home Exercise Plan  Access Code: [email or web address]     Consulted and Agree with Plan of Care  Patient       Patient will benefit from skilled therapeutic intervention in order to improve the following deficits and impairments:  Pain, Decreased range of motion, Decreased strength, Impaired perceived functional ability, Difficulty walking  Visit Diagnosis: Chronic pain of left knee  Muscle weakness (generalized)  Stiffness of left knee, not elsewhere classified     Problem List Patient Active Problem List   Diagnosis Date Noted  . Left knee pain 09/13/2018  . 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, [place], [place], [address] Phone: [phone]   Fax:  [phone]  Name: Makayla King MRN: [id number] Date of Birth: Jan 31, 1966

## 2018-10-21 ENCOUNTER — Ambulatory Visit: Payer: 59 | Admitting: Physical Therapy

## 2018-10-21 ENCOUNTER — Encounter: Payer: Self-pay | Admitting: Physical Therapy

## 2018-10-21 DIAGNOSIS — M6281 Muscle weakness (generalized): Secondary | ICD-10-CM

## 2018-10-21 DIAGNOSIS — M25562 Pain in left knee: Secondary | ICD-10-CM | POA: Diagnosis not present

## 2018-10-21 DIAGNOSIS — M25662 Stiffness of left knee, not elsewhere classified: Secondary | ICD-10-CM

## 2018-10-21 DIAGNOSIS — G8929 Other chronic pain: Secondary | ICD-10-CM

## 2018-10-21 NOTE — Therapy (Signed)
[facility] [address] [place], [place], [address] Phone: [phone]   Fax:  [phone]  Physical Therapy Treatment  Patient Details  Name: Makayla King MRN: [id number] Date of Birth: [birth year] Referring Provider (PT): Micheline Rough   Encounter Date: 10/21/2018  PT End of Session - 10/21/18 2031    Visit Number  6    Date for PT Re-Evaluation  11/11/18    Authorization Type  UHC     PT Start Time  0845    PT Stop Time  0930    PT Time Calculation (min)  45 min    Activity Tolerance  Patient tolerated treatment well       Past Medical History:  Diagnosis Date  . Anemia   . SVD (spontaneous vaginal delivery)    x 2    Past Surgical History:  Procedure Laterality Date  . APPENDECTOMY     2001  . DILITATION & CURRETTAGE/HYSTROSCOPY WITH NOVASURE ABLATION N/A 06/15/2013   Procedure: DILATATION & CURETTAGE/HYSTEROSCOPY WITH NOVASURE ABLATION;  Surgeon: Melina Schools, MD;  Location: [facility];  Service: Gynecology;  Laterality: N/A;    There were no vitals filed for this visit.  Subjective Assessment - 10/21/18 0851    Subjective  Its been more swollen and tight this week after prolonged sitting at work this week.  Also after tennis.  Makes it feel weird.  I like the ionto patch.      Currently in Pain?  No/denies    Pain Score  0-No pain                       [facility] Adult PT Treatment/Exercise - 10/21/18 0001      Knee/Hip Exercises: Aerobic   Stationary Bike  Level 2x 6 minutes      Knee/Hip Exercises: Standing   Step Down  Right;Left;15 reps;Hand Hold: 0;Step Height: 4"    SLS  standing with foot on wall resisted hip abduction with blue band around thighs 10x right/left     Other Standing Knee Exercises  blue band lateral step and back in crouched position and sidestepping 15x each    Other Standing Knee Exercises  floor sliders abduction and extension and combo       Iontophoresis   Type of Iontophoresis  Dexamethasone    Location  left lateral knee    Dose  4 mg/ml- 1cc   #5   Time  4-6 hour patch       Manual Therapy   Kinesiotex  Edema      Kinesiotix   Edema  instructed in KT edema control lateral knee                PT Short Term Goals - 10/14/18 0859      PT SHORT TERM GOAL #2   Title  The patient will report a 30% reduction in knee pain with walking and playing tennis    Baseline  25%     Time  4    Period  Weeks    Status  On-going      PT SHORT TERM GOAL #3   Title  The patient will have 140 degrees of left knee flexion needed for squatting and playing tennis    Baseline  145 degrees    Status  Achieved        PT Long Term Goals - 10/14/18 0858      PT LONG  TERM GOAL #2   Title  The patient will report a 60% improvement in knee pain with walking and playing tennis    Baseline  25%    Time  8    Period  Weeks    Status  On-going            Plan - 10/21/18 2031    Clinical Impression Statement  The patient has discomfort and decreased knee control with step down from a 6 inch step but much improved with 4 inch.  Discomfort primarily patellar tendon region.  No difficulty with more advanced glute strengthening ex.  Patient has her own KT tape.  Instructed in edema control method for lateral knee.  She should meet the remaining goals in the next 2 visits spread over the next 4 weeks.      Rehab Potential  Good    PT Frequency  1x / week    PT Duration  8 weeks    PT Treatment/Interventions  Iontophoresis 4mg /ml Dexamethasone;Cryotherapy;Electrical Stimulation;Ultrasound;Moist Heat;Therapeutic activities;Therapeutic exercise;Neuromuscular re-education;Patient/family education;Manual techniques;Dry needling;Taping;Vasopneumatic Device    PT Next Visit Plan   1 more ionto,  stability and hip strength;   patellar tendon taping as needed     PT Home Exercise Plan  Access Code: [email or web address]        Patient will benefit  from skilled therapeutic intervention in order to improve the following deficits and impairments:  Pain, Decreased range of motion, Decreased strength, Impaired perceived functional ability, Difficulty walking  Visit Diagnosis: Chronic pain of left knee  Muscle weakness (generalized)  Stiffness of left knee, not elsewhere classified     Problem List Patient Active Problem List   Diagnosis Date Noted  . Left knee pain 09/13/2018  . 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, [place], [place], [address] Phone: [phone]   Fax:  [phone]  Name: Makayla King MRN: [id number] Date of Birth: 09-19-1965

## 2018-10-26 ENCOUNTER — Encounter: Payer: Self-pay | Admitting: Gastroenterology

## 2018-10-26 ENCOUNTER — Ambulatory Visit (AMBULATORY_SURGERY_CENTER): Payer: 59 | Admitting: Gastroenterology

## 2018-10-26 VITALS — BP 98/62 | HR 70 | Temp 98.6°F | Resp 16 | Ht 62.0 in | Wt 115.0 lb

## 2018-10-26 DIAGNOSIS — Z1211 Encounter for screening for malignant neoplasm of colon: Secondary | ICD-10-CM

## 2018-10-26 DIAGNOSIS — D127 Benign neoplasm of rectosigmoid junction: Secondary | ICD-10-CM | POA: Diagnosis not present

## 2018-10-26 DIAGNOSIS — K635 Polyp of colon: Secondary | ICD-10-CM

## 2018-10-26 HISTORY — PX: COLONOSCOPY: SHX174

## 2018-10-26 MED ORDER — SODIUM CHLORIDE 0.9 % IV SOLN: 500.0000 mL | Freq: Once | INTRAVENOUS | Status: DC

## 2018-10-26 NOTE — Progress Notes (Signed)
Report given to PACU, vss 

## 2018-10-26 NOTE — Progress Notes (Signed)
Called to room to assist during endoscopic procedure.  Patient ID and intended procedure confirmed with present staff. Received instructions for my participation in the procedure from the performing physician.  

## 2018-10-26 NOTE — Patient Instructions (Signed)
Please read handouts provided. Await pathology results. Continue present medications.     YOU HAD AN ENDOSCOPIC PROCEDURE TODAY AT [facility]:   Refer to the procedure report that was given to you for any specific questions about what was found during the examination.  If the procedure report does not answer your questions, please call your gastroenterologist to clarify.  If you requested that your care partner not be given the details of your procedure findings, then the procedure report has been included in a sealed envelope for you to review at your convenience later.  YOU SHOULD EXPECT: Some feelings of bloating in the abdomen. Passage of more gas than usual.  Walking can help get rid of the air that was put into your GI tract during the procedure and reduce the bloating. If you had a lower endoscopy (such as a colonoscopy or flexible sigmoidoscopy) you may notice spotting of blood in your stool or on the toilet paper. If you underwent a bowel prep for your procedure, you may not have a normal bowel movement for a few days.  Please Note:  You might notice some irritation and congestion in your nose or some drainage.  This is from the oxygen used during your procedure.  There is no need for concern and it should clear up in a day or so.  SYMPTOMS TO REPORT IMMEDIATELY:   Following lower endoscopy (colonoscopy or flexible sigmoidoscopy):  Excessive amounts of blood in the stool  Significant tenderness or worsening of abdominal pains  Swelling of the abdomen that is new, acute  Fever of 100F or higher   For urgent or emergent issues, a gastroenterologist can be reached at any hour by calling [phone].   DIET:  We do recommend a small meal at first, but then you may proceed to your regular diet.  Drink plenty of fluids but you should avoid alcoholic beverages for 24 hours.  ACTIVITY:  You should plan to take it easy for the rest of today and you should NOT DRIVE  or use heavy machinery until tomorrow (because of the sedation medicines used during the test).    FOLLOW UP: Our staff will call the number listed on your records the next business day following your procedure to check on you and address any questions or concerns that you may have regarding the information given to you following your procedure. If we do not reach you, we will leave a message.  However, if you are feeling well and you are not experiencing any problems, there is no need to return our call.  We will assume that you have returned to your regular daily activities without incident.  If any biopsies were taken you will be contacted by phone or by letter within the next 1-3 weeks.  Please call us at [phone] if you have not heard about the biopsies in 3 weeks.    SIGNATURES/CONFIDENTIALITY: You and/or your care partner have signed paperwork which will be entered into your electronic medical record.  These signatures attest to the fact that that the information above on your After Visit Summary has been reviewed and is understood.  Full responsibility of the confidentiality of this discharge information lies with you and/or your care-partner.

## 2018-10-26 NOTE — Op Note (Signed)
[place] Patient Name: Makayla King Procedure Date: 10/26/2018 11:19 AM MRN: [id number] Endoscopist: Remo Lipps P. Havery Moros , MD Age: 53 Referring MD:  Date of Birth: Oct 13, 1965 Gender: Female Account #: [id number] Procedure:                Colonoscopy Indications:              Screening for colorectal malignant neoplasm, This                            is the patient's first colonoscopy Medicines:                Monitored Anesthesia Care Procedure:                Pre-Anesthesia Assessment:                           - Prior to the procedure, a History and Physical                            was performed, and patient medications and                            allergies were reviewed. The patient's tolerance of                            previous anesthesia was also reviewed. The risks                            and benefits of the procedure and the sedation                            options and risks were discussed with the patient.                            All questions were answered, and informed consent                            was obtained. Prior Anticoagulants: The patient has                            taken no previous anticoagulant or antiplatelet                            agents. ASA Grade Assessment: I - A normal, healthy                            patient. After reviewing the risks and benefits,                            the patient was deemed in satisfactory condition to                            undergo the procedure.  After obtaining informed consent, the colonoscope                            was passed under direct vision. Throughout the                            procedure, the patient's blood pressure, pulse, and                            oxygen saturations were monitored continuously. The                            Model PCF-H190DL [id number]) scope was introduced                            through the anus and advanced  to the the cecum,                            identified by appendiceal orifice and ileocecal                            valve. The colonoscopy was performed without                            difficulty. The patient tolerated the procedure                            well. The quality of the bowel preparation was                            adequate. The ileocecal valve, appendiceal orifice,                            and rectum were photographed. Scope In: 11:29:24 AM Scope Out: 11:57:38 AM Scope Withdrawal Time: 0 hours 20 minutes 43 seconds  Total Procedure Duration: 0 hours 28 minutes 14 seconds  Findings:                 The perianal and digital rectal examinations were                            normal.                           Five sessile polyps were found in the recto-sigmoid                            colon. The polyps were 2 to 4 mm in size. These                            polyps were removed with a cold snare. Resection                            and retrieval were complete.  The sigmoid colon was tortuous.                           The exam was otherwise without abnormality. Complications:            No immediate complications. Estimated blood loss:                            Minimal. Estimated Blood Loss:     Estimated blood loss was minimal. Impression:               - Five 2 to 4 mm polyps at the recto-sigmoid colon,                            removed with a cold snare. Resected and retrieved.                           - Tortuous colon.                           - The examination was otherwise normal. Recommendation:           - Patient has a contact number available for                            emergencies. The signs and symptoms of potential                            delayed complications were discussed with the                            patient. Return to normal activities tomorrow.                            Written discharge instructions  were provided to the                            patient.                           - Resume previous diet.                           - Continue present medications.                           - Await pathology results. Remo Lipps P. Armbruster, MD 10/26/2018 12:00:53 PM This report has been signed electronically.

## 2018-10-26 NOTE — Progress Notes (Signed)
Pt's states no medical or surgical changes since previsit or office visit. 

## 2018-10-27 ENCOUNTER — Telehealth: Payer: Self-pay

## 2018-10-27 NOTE — Telephone Encounter (Signed)
  Follow up Call-  Call back number 10/26/2018  Post procedure Call Back phone  # [phone]  Permission to leave phone message Yes  Some recent data might be hidden     Patient questions:  Do you have a fever, pain , or abdominal swelling? No. Pain Score  0 *  Have you tolerated food without any problems? Yes.    Have you been able to return to your normal activities? Yes.    Do you have any questions about your discharge instructions: Diet   No. Medications  No. Follow up visit  No.  Do you have questions or concerns about your Care? No.  Actions: * If pain score is 4 or above: No action needed, pain <4.

## 2018-10-29 ENCOUNTER — Encounter: Payer: Self-pay | Admitting: Gastroenterology

## 2018-11-02 ENCOUNTER — Encounter: Payer: Self-pay | Admitting: Physical Therapy

## 2018-11-02 ENCOUNTER — Ambulatory Visit: Payer: 59 | Admitting: Physical Therapy

## 2018-11-02 DIAGNOSIS — M25662 Stiffness of left knee, not elsewhere classified: Secondary | ICD-10-CM

## 2018-11-02 DIAGNOSIS — M25562 Pain in left knee: Principal | ICD-10-CM

## 2018-11-02 DIAGNOSIS — M6281 Muscle weakness (generalized): Secondary | ICD-10-CM

## 2018-11-02 DIAGNOSIS — G8929 Other chronic pain: Secondary | ICD-10-CM

## 2018-11-02 NOTE — Therapy (Signed)
[facility] [address] [place], [place], [address] Phone: [phone]   Fax:  [phone]  Physical Therapy Treatment  Patient Details  Name: Makayla King MRN: [id number] Date of Birth: 1966-08-13 Referring Provider (PT): Micheline Rough   Encounter Date: 11/02/2018  PT End of Session - 11/02/18 1852    Visit Number  7    Date for PT Re-Evaluation  11/11/18    Authorization Type  UHC     PT Start Time  0845    PT Stop Time  0930    PT Time Calculation (min)  45 min    Activity Tolerance  Patient tolerated treatment well       Past Medical History:  Diagnosis Date  . Anemia   . SVD (spontaneous vaginal delivery)    x 2    Past Surgical History:  Procedure Laterality Date  . APPENDECTOMY     2001  . DILITATION & CURRETTAGE/HYSTROSCOPY WITH NOVASURE ABLATION N/A 06/15/2013   Procedure: DILATATION & CURETTAGE/HYSTEROSCOPY WITH NOVASURE ABLATION;  Surgeon: Melina Schools, MD;  Location: [facility];  Service: Gynecology;  Laterality: N/A;    There were no vitals filed for this visit.  Subjective Assessment - 11/02/18 1854    Subjective  I got a massage for my birthday and she told me my ITB was tight.  Lateral knee swelling after walking 3 miles.  Haven't played tennis this week.      Limitations  Walking;House hold activities    Currently in Pain?  No/denies    Pain Score  0-No pain                       [facility] Adult PT Treatment/Exercise - 11/02/18 0001      Knee/Hip Exercises: Stretches   ITB Stretch  Left;3 reps;20 seconds    ITB Stretch Limitations  standing and sidelying      Knee/Hip Exercises: Aerobic   Stationary Bike  Level 2x 6 minutes      Knee/Hip Exercises: Standing   Hip Extension  Stengthening;Right;Left;3 sets;10 reps    Extension Limitations  blue band around thighs     Step Down  Right;Left;15 reps;Hand Hold: 0;Step Height: 4"      Knee/Hip Exercises:  Supine   Other Supine Knee/Hip Exercises  ab brace with single leg lower 10x right/left      Knee/Hip Exercises: Prone   Other Prone Exercises  planks 5x 20 sec       Iontophoresis   Type of Iontophoresis  Dexamethasone    Location  left lateral knee    Dose  4 mg/ml- 1cc   #6   Time  4-6 hour patch       Manual Therapy   Soft tissue mobilization  Addaday instrument assisted to ITB, lateral quads    McConnell  patellar tendon taping                PT Short Term Goals - 11/02/18 1907      PT SHORT TERM GOAL #1   Title  The patient will have knowledge of basic self care techniques to control pain, swelling and for ROM ex's    Status  Achieved      PT SHORT TERM GOAL #2   Title  The patient will report a 30% reduction in knee pain with walking and playing tennis    Time  4    Period  Weeks  Status  On-going      PT SHORT TERM GOAL #3   Title  The patient will have 140 degrees of left knee flexion needed for squatting and playing tennis    Status  Achieved        PT Long Term Goals - 10/14/18 0858      PT LONG TERM GOAL #2   Title  The patient will report a 60% improvement in knee pain with walking and playing tennis    Baseline  25%    Time  8    Period  Weeks    Status  On-going            Plan - 11/02/18 1853    Clinical Impression Statement  No visible swelling today but patient states this has bothered her some this week more so than inferior knee pain.  No pain today with 4 inch step downs, with muscular fatigue patellofemoral alignment noted.  Gluteal muscle fatigue with standing extension and abduction ex's.  Good transverse abdominus activation in supine and with planking.  Therapist closely monitoring response and and providing cues for alignment.      Rehab Potential  Good    PT Frequency  1x / week    PT Duration  8 weeks    PT Treatment/Interventions  Iontophoresis 4mg /ml Dexamethasone;Cryotherapy;Electrical Stimulation;Ultrasound;Moist  Heat;Therapeutic activities;Therapeutic exercise;Neuromuscular re-education;Patient/family education;Manual techniques;Dry needling;Taping;Vasopneumatic Device    PT Next Visit Plan    stability and hip strength; assess response to   patellar tendon taping;  check MMT, ROM;  FOTO; check remaining goals;  possible discharge next visit    PT Home Exercise Plan  Access Code: [email or web address]        Patient will benefit from skilled therapeutic intervention in order to improve the following deficits and impairments:  Pain, Decreased range of motion, Decreased strength, Impaired perceived functional ability, Difficulty walking  Visit Diagnosis: Chronic pain of left knee  Muscle weakness (generalized)  Stiffness of left knee, not elsewhere classified     Problem List Patient Active Problem List   Diagnosis Date Noted  . Left knee pain 09/13/2018  . 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, [place], [place], [address] Phone: [phone]   Fax:  [phone]  Name: Makayla King MRN: [id number] Date of Birth: [birth year]

## 2018-11-16 ENCOUNTER — Ambulatory Visit: Payer: 59 | Attending: Family Medicine | Admitting: Physical Therapy

## 2018-11-16 ENCOUNTER — Encounter: Payer: Self-pay | Admitting: Physical Therapy

## 2018-11-16 DIAGNOSIS — M25562 Pain in left knee: Secondary | ICD-10-CM | POA: Insufficient documentation

## 2018-11-16 DIAGNOSIS — M6281 Muscle weakness (generalized): Secondary | ICD-10-CM | POA: Diagnosis not present

## 2018-11-16 DIAGNOSIS — M25662 Stiffness of left knee, not elsewhere classified: Secondary | ICD-10-CM | POA: Diagnosis present

## 2018-11-16 DIAGNOSIS — G8929 Other chronic pain: Secondary | ICD-10-CM

## 2018-11-16 NOTE — Therapy (Signed)
[facility] [address] [place], [place], [address] Phone: [phone]   Fax:  [phone]  Physical Therapy Treatment/Discharge Summary   Patient Details  Name: Makayla King MRN: [id number] Date of Birth: 12/08/1965 Referring Provider (PT): Micheline Rough   Encounter Date: 11/16/2018  PT End of Session - 11/16/18 1904    Visit Number  8    Date for PT Re-Evaluation  11/11/18    Authorization Type  UHC     PT Start Time  0845    PT Stop Time  0925    PT Time Calculation (min)  40 min    Activity Tolerance  Patient tolerated treatment well       Past Medical History:  Diagnosis Date  . Anemia   . SVD (spontaneous vaginal delivery)    x 2    Past Surgical History:  Procedure Laterality Date  . APPENDECTOMY     2001  . DILITATION & CURRETTAGE/HYSTROSCOPY WITH NOVASURE ABLATION N/A 06/15/2013   Procedure: DILATATION & CURETTAGE/HYSTEROSCOPY WITH NOVASURE ABLATION;  Surgeon: Melina Schools, MD;  Location: [facility];  Service: Gynecology;  Laterality: N/A;    There were no vitals filed for this visit.  Subjective Assessment - 11/16/18 0848    Subjective  No tennis but walked a lot this week.  A little swelling but no pain.  Exercises going well.      Currently in Pain?  No/denies    Pain Score  0-No pain    Pain Orientation  Left    Pain Type  Chronic pain         [facility] PT Assessment - 11/16/18 0001      Observation/Other Assessments   Focus on Therapeutic Outcomes (FOTO)   25% limitation       AROM   Right Knee Extension  0    Right Knee Flexion  143    Left Knee Extension  0    Left Knee Flexion  145      Strength   Overall Strength Comments  no pelvic drop with SLS    Right Hip ABduction  5/5    Left Hip Extension  5/5    Left Hip ABduction  5/5    Right Knee Flexion  5/5    Right Knee Extension  5/5    Left Knee Flexion  5/5    Left Knee Extension  5/5                    [facility] Adult PT Treatment/Exercise - 11/16/18 0001      Self-Care   Self-Care  ADL's    ADL's  discussion of self care after tennis; tape for edema control, building strength for heavy activities like carrying a cooler up and down the steps       Knee/Hip Exercises: Aerobic   Stationary Bike  Level 2x 6 minutes      Knee/Hip Exercises: Standing   Other Standing Knee Exercises  dead lifts 10#  2x8    Other Standing Knee Exercises  discussion of safe self progression of HEP              PT Education - 11/16/18 1904    Education Details  dead lifts     Person(s) Educated  Patient    Methods  Explanation;Demonstration;Handout    Comprehension  Verbal cues required;Returned demonstration;Verbalized understanding       PT Short Term Goals - 11/16/18 1911  PT SHORT TERM GOAL #1   Title  The patient will have knowledge of basic self care techniques to control pain, swelling and for ROM ex's    Status  Achieved      PT SHORT TERM GOAL #2   Title  The patient will report a 30% reduction in knee pain with walking and playing tennis    Status  Achieved      PT SHORT TERM GOAL #3   Title  The patient will have 140 degrees of left knee flexion needed for squatting and playing tennis    Status  Achieved        PT Long Term Goals - 11/16/18 1912      PT LONG TERM GOAL #1   Title  The patient will be independent in safe self progression of HEP    Status  Achieved      PT LONG TERM GOAL #2   Title  The patient will report a 60% improvement in knee pain with walking and playing tennis    Status  Achieved      PT LONG TERM GOAL #3   Title  The patient will have 4+/5 quad, gluteal and trunk/core strength needed for running and playing tennis    Status  Achieved      PT LONG TERM GOAL #4   Title  FOTO functional outcome score improved from 36% limitation to 27% indicating improved function with less pain    Status  Achieved             Plan - 11/16/18 0911    Clinical Impression Statement  The patient has full and painfree knee ROM and full strength 5/5 in knee, hip and trunk.  No pain with manual muscle testing.  Her FOTO functional outcome score has significantly improved from 36% limitation to 25%.  She is independent in a HEP and has met all rehab goals.  Will discharge from PT at this time.      PT Home Exercise Plan  Access Code: [email or web address]        Patient will benefit from skilled therapeutic intervention in order to improve the following deficits and impairments:     Visit Diagnosis: Chronic pain of left knee  Muscle weakness (generalized)  Stiffness of left knee, not elsewhere classified    PHYSICAL THERAPY DISCHARGE SUMMARY  Visits from Start of Care: 8  Current functional level related to goals / functional outcomes: See clinical impressions above   Remaining deficits: As above   Education / Equipment: Comprehensive HEP Plan: Patient agrees to discharge.  Patient goals were met. Patient is being discharged due to meeting the stated rehab goals.  ?????         Problem List Patient Active Problem List   Diagnosis Date Noted  . Left knee pain 09/13/2018  . 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, [place], [place], [address] Phone: [phone]   Fax:  [phone]  Name: Makayla King MRN: [id number] Date of Birth: 1966/06/29

## 2018-11-16 NOTE — Patient Instructions (Signed)
    The Dead Lift:  Feet under shoulders  Hip hinge until fingertips reach the kneecaps  Then squat to mid shin level:  Weight should be 8 reps with only 2-3 reps additional at the very max                                                  2-3 sets of 8   Squeeze shoulder blades and head up to rise all together     Ruben Im PT [facility] [address], [place], [place] [address] Phone # [phone] Fax [phone]

## 2018-12-20 ENCOUNTER — Encounter: Payer: 59 | Admitting: Family Medicine

## 2019-03-23 ENCOUNTER — Ambulatory Visit (INDEPENDENT_AMBULATORY_CARE_PROVIDER_SITE_OTHER): Payer: 59 | Admitting: Family Medicine

## 2019-03-23 ENCOUNTER — Encounter: Payer: Self-pay | Admitting: Family Medicine

## 2019-03-23 ENCOUNTER — Other Ambulatory Visit: Payer: Self-pay

## 2019-03-23 VITALS — BP 100/50 | HR 63 | Temp 98.3°F | Ht 61.0 in | Wt 111.3 lb

## 2019-03-23 DIAGNOSIS — M25562 Pain in left knee: Secondary | ICD-10-CM

## 2019-03-23 DIAGNOSIS — G8929 Other chronic pain: Secondary | ICD-10-CM

## 2019-03-23 DIAGNOSIS — L409 Psoriasis, unspecified: Secondary | ICD-10-CM

## 2019-03-23 DIAGNOSIS — E785 Hyperlipidemia, unspecified: Secondary | ICD-10-CM | POA: Diagnosis not present

## 2019-03-23 DIAGNOSIS — Z0000 Encounter for general adult medical examination without abnormal findings: Secondary | ICD-10-CM

## 2019-03-23 LAB — LIPID PANEL
Cholesterol: 217 mg/dL — ABNORMAL HIGH (ref 0–200)
HDL: 83 mg/dL (ref >39.00)
LDL Cholesterol: 121 mg/dL — ABNORMAL HIGH (ref 0–99)
NonHDL: 133.79
Total CHOL/HDL Ratio: 3
Triglycerides: 65 mg/dL (ref 0.0–149.0)
VLDL: 13 mg/dL (ref 0.0–40.0)

## 2019-03-23 NOTE — Patient Instructions (Addendum)
Schedule obgyn appointment Ask them about last Tdap and if not recorded I recommend you consider this  Fat and Cholesterol Restricted Eating Plan Eating a diet that limits fat and cholesterol may help lower your risk for heart disease and other conditions. Your body needs fat and cholesterol for basic functions, but eating too much of these things can be harmful to your health. Your health care provider may order lab tests to check your blood fat (lipid) and cholesterol levels. This helps your health care provider understand your risk for certain conditions and whether you need to make diet changes. Work with your health care provider or dietitian to make an eating plan that is right for you. Your plan includes:  Limit your fat intake to ______% or less of your total calories a day.  Limit your saturated fat intake to ______% or less of your total calories a day.  Limit the amount of cholesterol in your diet to less than _________mg a day.  Eat ___________ g of fiber a day. What are tips for following this plan? General guidelines   If you are overweight, work with your health care provider to lose weight safely. Losing just 5-10% of your body weight can improve your overall health and help prevent diseases such as diabetes and heart disease.  Avoid: ? Foods with added sugar. ? Fried foods. ? Foods that contain partially hydrogenated oils, including stick margarine, some tub margarines, cookies, crackers, and other baked goods.  Limit alcohol intake to no more than 1 drink a day for nonpregnant women and 2 drinks a day for men. One drink equals 12 oz of beer, 5 oz of wine, or 1 oz of hard liquor. Reading food labels  Check food labels for: ? Trans fats, partially hydrogenated oils, or high amounts of saturated fat. Avoid foods that contain saturated fat and trans fat. ? The amount of cholesterol in each serving. Try to eat no more than 200 mg of cholesterol each day. ? The amount of  fiber in each serving. Try to eat at least 20-30 g of fiber each day.  Choose foods with healthy fats, such as: ? Monounsaturated and polyunsaturated fats. These include olive and canola oil, flaxseeds, walnuts, almonds, and seeds. ? Omega-3 fats. These are found in foods such as salmon, mackerel, sardines, tuna, flaxseed oil, and ground flaxseeds.  Choose grain products that have whole grains. Look for the word "whole" as the first word in the ingredient list. Cooking  Cook foods using methods other than frying. Baking, boiling, grilling, and broiling are some healthy options.  Eat more home-cooked food and less restaurant, buffet, and fast food.  Avoid cooking using saturated fats. ? Animal sources of saturated fats include meats, butter, and cream. ? Plant sources of saturated fats include palm oil, palm kernel oil, and coconut oil. Meal planning   At meals, imagine dividing your plate into fourths: ? Fill one-half of your plate with vegetables and green salads. ? Fill one-fourth of your plate with whole grains. ? Fill one-fourth of your plate with lean protein foods.  Eat fish that is high in omega-3 fats at least two times a week.  Eat more foods that contain fiber, such as whole grains, beans, apples, broccoli, carrots, peas, and barley. These foods help promote healthy cholesterol levels in the blood. Recommended foods Grains  Whole grains, such as whole wheat or whole grain breads, crackers, cereals, and pasta. Unsweetened oatmeal, bulgur, barley, quinoa, or brown rice. Corn or  whole wheat flour tortillas. Vegetables  Fresh or frozen vegetables (raw, steamed, roasted, or grilled). Green salads. Fruits  All fresh, canned (in natural juice), or frozen fruits. Meats and other protein foods  Ground beef (85% or leaner), grass-fed beef, or beef trimmed of fat. Skinless chicken or [place]. Ground chicken or [place]. Pork trimmed of fat. All fish and seafood. Egg whites. Dried  beans, peas, or lentils. Unsalted nuts or seeds. Unsalted canned beans. Natural nut butters without added sugar and oil. Dairy  Low-fat or nonfat dairy products, such as skim or 1% milk, 2% or reduced-fat cheeses, low-fat and fat-free ricotta or cottage cheese, or plain low-fat and nonfat yogurt. Fats and oils  Tub margarine without trans fats. Light or reduced-fat mayonnaise and salad dressings. Avocado. Olive, canola, sesame, or safflower oils. The items listed above may not be a complete list of recommended foods or beverages. Contact your dietitian for more options. Foods to avoid Grains  White bread. White pasta. White rice. Cornbread. Bagels, pastries, and croissants. Crackers and snack foods that contain trans fat and hydrogenated oils. Vegetables  Vegetables cooked in cheese, cream, or butter sauce. Fried vegetables. Fruits  Canned fruit in heavy syrup. Fruit in cream or butter sauce. Fried fruit. Meats and other protein foods  Fatty cuts of meat. Ribs, chicken wings, bacon, sausage, bologna, salami, chitterlings, fatback, hot dogs, bratwurst, and packaged lunch meats. Liver and organ meats. Whole eggs and egg yolks. Chicken and [place] with skin. Fried meat. Dairy  Whole or 2% milk, cream, half-and-half, and cream cheese. Whole milk cheeses. Whole-fat or sweetened yogurt. Full-fat cheeses. Nondairy creamers and whipped toppings. Processed cheese, cheese spreads, and cheese curds. Beverages  Alcohol. Sugar-sweetened drinks such as sodas, lemonade, and fruit drinks. Fats and oils  Butter, stick margarine, lard, shortening, ghee, or bacon fat. Coconut, palm kernel, and palm oils. Sweets and desserts  Corn syrup, sugars, honey, and molasses. Candy. Jam and jelly. Syrup. Sweetened cereals. Cookies, pies, cakes, donuts, muffins, and ice cream. The items listed above may not be a complete list of foods and beverages to avoid. Contact your dietitian for more  information. Summary  Your body needs fat and cholesterol for basic functions. However, eating too much of these things can be harmful to your health.  Work with your health care provider and dietitian to follow a diet low in fat and cholesterol. Doing this may help lower your risk for heart disease and other conditions.  Choose healthy fats, such as monounsaturated and polyunsaturated fats, and foods high in omega-3 fatty acids.  Eat fiber-rich foods, such as whole grains, beans, peas, fruits, and vegetables.  Limit or avoid alcohol, fried foods, and foods high in saturated fats, partially hydrogenated oils, and sugar. This information is not intended to replace advice given to you by your health care provider. Make sure you discuss any questions you have with your health care provider. Document Released: 08/25/2005 Document Revised: 08/07/2017 Document Reviewed: 05/12/2017 Elsevier Patient Education  2020 [place] and Cholesterol Restricted Eating Plan Eating a diet that limits fat and cholesterol may help lower your risk for heart disease and other conditions. Your body needs fat and cholesterol for basic functions, but eating too much of these things can be harmful to your health. Your health care provider may order lab tests to check your blood fat (lipid) and cholesterol levels. This helps your health care provider understand your risk for certain conditions and whether you need to make diet changes. Work  with your health care provider or dietitian to make an eating plan that is right for you. Your plan includes:  Limit your fat intake to ______% or less of your total calories a day.  Limit your saturated fat intake to ______% or less of your total calories a day.  Limit the amount of cholesterol in your diet to less than _________mg a day.  Eat ___________ g of fiber a day. What are tips for following this plan? General guidelines   If you are overweight, work with  your health care provider to lose weight safely. Losing just 5-10% of your body weight can improve your overall health and help prevent diseases such as diabetes and heart disease.  Avoid: ? Foods with added sugar. ? Fried foods. ? Foods that contain partially hydrogenated oils, including stick margarine, some tub margarines, cookies, crackers, and other baked goods.  Limit alcohol intake to no more than 1 drink a day for nonpregnant women and 2 drinks a day for men. One drink equals 12 oz of beer, 5 oz of wine, or 1 oz of hard liquor. Reading food labels  Check food labels for: ? Trans fats, partially hydrogenated oils, or high amounts of saturated fat. Avoid foods that contain saturated fat and trans fat. ? The amount of cholesterol in each serving. Try to eat no more than 200 mg of cholesterol each day. ? The amount of fiber in each serving. Try to eat at least 20-30 g of fiber each day.  Choose foods with healthy fats, such as: ? Monounsaturated and polyunsaturated fats. These include olive and canola oil, flaxseeds, walnuts, almonds, and seeds. ? Omega-3 fats. These are found in foods such as salmon, mackerel, sardines, tuna, flaxseed oil, and ground flaxseeds.  Choose grain products that have whole grains. Look for the word "whole" as the first word in the ingredient list. Cooking  Cook foods using methods other than frying. Baking, boiling, grilling, and broiling are some healthy options.  Eat more home-cooked food and less restaurant, buffet, and fast food.  Avoid cooking using saturated fats. ? Animal sources of saturated fats include meats, butter, and cream. ? Plant sources of saturated fats include palm oil, palm kernel oil, and coconut oil. Meal planning   At meals, imagine dividing your plate into fourths: ? Fill one-half of your plate with vegetables and green salads. ? Fill one-fourth of your plate with whole grains. ? Fill one-fourth of your plate with lean  protein foods.  Eat fish that is high in omega-3 fats at least two times a week.  Eat more foods that contain fiber, such as whole grains, beans, apples, broccoli, carrots, peas, and barley. These foods help promote healthy cholesterol levels in the blood. Recommended foods Grains  Whole grains, such as whole wheat or whole grain breads, crackers, cereals, and pasta. Unsweetened oatmeal, bulgur, barley, quinoa, or brown rice. Corn or whole wheat flour tortillas. Vegetables  Fresh or frozen vegetables (raw, steamed, roasted, or grilled). Green salads. Fruits  All fresh, canned (in natural juice), or frozen fruits. Meats and other protein foods  Ground beef (85% or leaner), grass-fed beef, or beef trimmed of fat. Skinless chicken or [place]. Ground chicken or [place]. Pork trimmed of fat. All fish and seafood. Egg whites. Dried beans, peas, or lentils. Unsalted nuts or seeds. Unsalted canned beans. Natural nut butters without added sugar and oil. Dairy  Low-fat or nonfat dairy products, such as skim or 1% milk, 2% or reduced-fat cheeses, low-fat  and fat-free ricotta or cottage cheese, or plain low-fat and nonfat yogurt. Fats and oils  Tub margarine without trans fats. Light or reduced-fat mayonnaise and salad dressings. Avocado. Olive, canola, sesame, or safflower oils. The items listed above may not be a complete list of recommended foods or beverages. Contact your dietitian for more options. Foods to avoid Grains  White bread. White pasta. White rice. Cornbread. Bagels, pastries, and croissants. Crackers and snack foods that contain trans fat and hydrogenated oils. Vegetables  Vegetables cooked in cheese, cream, or butter sauce. Fried vegetables. Fruits  Canned fruit in heavy syrup. Fruit in cream or butter sauce. Fried fruit. Meats and other protein foods  Fatty cuts of meat. Ribs, chicken wings, bacon, sausage, bologna, salami, chitterlings, fatback, hot dogs, bratwurst, and  packaged lunch meats. Liver and organ meats. Whole eggs and egg yolks. Chicken and [place] with skin. Fried meat. Dairy  Whole or 2% milk, cream, half-and-half, and cream cheese. Whole milk cheeses. Whole-fat or sweetened yogurt. Full-fat cheeses. Nondairy creamers and whipped toppings. Processed cheese, cheese spreads, and cheese curds. Beverages  Alcohol. Sugar-sweetened drinks such as sodas, lemonade, and fruit drinks. Fats and oils  Butter, stick margarine, lard, shortening, ghee, or bacon fat. Coconut, palm kernel, and palm oils. Sweets and desserts  Corn syrup, sugars, honey, and molasses. Candy. Jam and jelly. Syrup. Sweetened cereals. Cookies, pies, cakes, donuts, muffins, and ice cream. The items listed above may not be a complete list of foods and beverages to avoid. Contact your dietitian for more information. Summary  Your body needs fat and cholesterol for basic functions. However, eating too much of these things can be harmful to your health.  Work with your health care provider and dietitian to follow a diet low in fat and cholesterol. Doing this may help lower your risk for heart disease and other conditions.  Choose healthy fats, such as monounsaturated and polyunsaturated fats, and foods high in omega-3 fatty acids.  Eat fiber-rich foods, such as whole grains, beans, peas, fruits, and vegetables.  Limit or avoid alcohol, fried foods, and foods high in saturated fats, partially hydrogenated oils, and sugar. This information is not intended to replace advice given to you by your health care provider. Make sure you discuss any questions you have with your health care provider. Document Released: 08/25/2005 Document Revised: 08/07/2017 Document Reviewed: 05/12/2017 Elsevier Patient Education  2020 [employer].

## 2019-03-23 NOTE — Progress Notes (Signed)
Makayla King DOB: November 23, 1965 Encounter date: 03/23/2019  This is a 53 y.o. female who presents for complete physical   History of present illness/Additional concerns: No specific concerns today.  At last visit we discussed multiple concerns -  Left knee pain - discussed PT/supplements to help with this. It is doing much better. Completed PT. Still playing tennis regularly. Sometimes will have small amount of lateral swelling cavity, is not painful, and this resolves with rest. Psoriasis scalp/skin moles - referred to derm: Has appointment with dermatology tomorrow.  Still having some issues with psoriasis posterior base of scalp. Referred for mammogram which was completed 09/2018. Normal/negative. Breast pain has improved.  She feels like this may have been more stress related.  Seems to be better with increasing activity level better stress management. Referred for colonoscopy and completed in 10/2018. Repeat suggested in 10 years.   Past Medical History:  Diagnosis Date  . Anemia   . SVD (spontaneous vaginal delivery)    x 2   Past Surgical History:  Procedure Laterality Date  . APPENDECTOMY     2001  . DILITATION & CURRETTAGE/HYSTROSCOPY WITH NOVASURE ABLATION N/A 06/15/2013   Procedure: DILATATION & CURETTAGE/HYSTEROSCOPY WITH NOVASURE ABLATION;  Surgeon: Melina Schools, MD;  Location: [facility];  Service: Gynecology;  Laterality: N/A;   No Known Allergies Current Meds  Medication Sig  . fluocinolone (SYNALAR) 0.01 % external solution Apply topically 2 (two) times daily.  . Multiple Vitamins-Minerals (MULTI VITAMIN/MINERALS) TABS Take 1 tablet by mouth 2 (two) times daily. One Source  . Omega-3 Fatty Acids (FISH OIL OMEGA-3 PO) Take 1,200 mg by mouth.   Social History   Tobacco Use  . Smoking status: Never Smoker  . Smokeless tobacco: Never Used  Substance Use Topics  . Alcohol use: Yes    Comment: socially   Family History  Problem Relation Age of Onset  . High  blood pressure Mother   . High blood pressure Father   . Colon cancer Father        uncertain details  . High blood pressure Sister   . Breast cancer Maternal Grandmother 80  . Diabetes Maternal Grandfather   . Heart disease Maternal Grandfather   . CAD Maternal Grandfather   . Multiple sclerosis Paternal Grandmother   . Emphysema Paternal Grandfather        smoker  . Esophageal cancer Neg Hx   . Rectal cancer Neg Hx   . Stomach cancer Neg Hx      Review of Systems  Constitutional: Negative for activity change, appetite change, chills, fatigue, fever and unexpected weight change.  HENT: Negative for congestion, ear pain, hearing loss, sinus pressure, sinus pain, sore throat and trouble swallowing.   Eyes: Negative for pain and visual disturbance.  Respiratory: Negative for cough, chest tightness, shortness of breath and wheezing.   Cardiovascular: Negative for chest pain, palpitations and leg swelling.  Gastrointestinal: Negative for abdominal pain, blood in stool, constipation, diarrhea, nausea and vomiting.  Genitourinary: Negative for difficulty urinating and menstrual problem.  Musculoskeletal: Negative for arthralgias and back pain.  Skin: Negative for rash.  Neurological: Negative for dizziness, weakness, numbness and headaches.  Hematological: Negative for adenopathy. Does not bruise/bleed easily.  Psychiatric/Behavioral: Negative for sleep disturbance and suicidal ideas. The patient is not nervous/anxious.     CBC:  Lab Results  Component Value Date   WBC 4.4 09/13/2018   HGB 14.8 09/13/2018   HCT 42.8 09/13/2018   MCH 27.9 06/15/2013  MCHC 34.6 09/13/2018   RDW 13.4 09/13/2018   PLT 230.0 09/13/2018   CMP: Lab Results  Component Value Date   NA 138 09/13/2018   K 4.4 09/13/2018   CL 102 09/13/2018   CO2 24 09/13/2018   GLUCOSE 69 (L) 09/13/2018   BUN 15 09/13/2018   CREATININE 0.75 09/13/2018   GFRAA >90 06/15/2013   CALCIUM 9.8 09/13/2018   PROT 7.2  09/13/2018   BILITOT 0.7 09/13/2018   ALKPHOS 55 09/13/2018   ALT 20 09/13/2018   AST 17 09/13/2018   LIPID: Lab Results  Component Value Date   CHOL 217 (H) 03/23/2019   TRIG 65.0 03/23/2019   HDL 83.00 03/23/2019   LDLCALC 121 (H) 03/23/2019    Objective:  BP (!) 100/50 (BP Location: Left Arm, Patient Position: Sitting, Cuff Size: Normal)   Pulse 63   Temp 98.3 F (36.8 C) (Temporal)   Ht 5\' 1"  (1.549 m)   Wt 111 lb 4.8 oz (50.5 kg)   LMP 05/18/2013   SpO2 98%   BMI 21.03 kg/m   Weight: 111 lb 4.8 oz (50.5 kg)   BP Readings from Last 3 Encounters:  03/23/19 (!) 100/50  10/26/18 98/62  09/13/18 108/70   Wt Readings from Last 3 Encounters:  03/23/19 111 lb 4.8 oz (50.5 kg)  10/26/18 115 lb (52.2 kg)  10/12/18 118 lb 3.2 oz (53.6 kg)    Physical Exam Constitutional:      General: She is not in acute distress.    Appearance: She is well-developed.  HENT:     Head: Normocephalic and atraumatic.     Right Ear: External ear normal.     Left Ear: External ear normal.     Mouth/Throat:     Pharynx: No oropharyngeal exudate.  Eyes:     Conjunctiva/sclera: Conjunctivae normal.     Pupils: Pupils are equal, round, and reactive to light.  Neck:     Musculoskeletal: Normal range of motion and neck supple.     Thyroid: No thyromegaly.  Cardiovascular:     Rate and Rhythm: Normal rate and regular rhythm.     Heart sounds: Normal heart sounds. No murmur. No friction rub. No gallop.   Pulmonary:     Effort: Pulmonary effort is normal.     Breath sounds: Normal breath sounds.  Abdominal:     General: Bowel sounds are normal. There is no distension.     Palpations: Abdomen is soft. There is no mass.     Tenderness: There is no abdominal tenderness. There is no guarding.     Hernia: No hernia is present.  Musculoskeletal: Normal range of motion.        General: No tenderness or deformity.  Lymphadenopathy:     Cervical: No cervical adenopathy.  Skin:    General:  Skin is warm and dry.     Findings: No rash.     Comments: Red and flaking scalp posterior central.  Neurological:     Mental Status: She is alert and oriented to person, place, and time.     Deep Tendon Reflexes: Reflexes normal.     Reflex Scores:      Tricep reflexes are 2+ on the right side and 2+ on the left side.      Bicep reflexes are 2+ on the right side and 2+ on the left side.      Brachioradialis reflexes are 2+ on the right side and 2+ on the left side.  Patellar reflexes are 2+ on the right side and 2+ on the left side. Psychiatric:        Speech: Speech normal.        Behavior: Behavior normal.        Thought Content: Thought content normal.     Assessment/Plan: Health Maintenance Due  Topic Date Due  . TETANUS/TDAP  11/01/1985  . PAP SMEAR-Modifier  09/08/2016   1. Preventative health care Keep up with exercise and healthier life habits.  She is up-to-date with preventative care measures.  2. Hyperlipidemia, unspecified hyperlipidemia type: We will recheck lipids today for comparison to previous.  We reviewed all blood work together.  Lipids were slightly high when last checked.  Recommended low-cholesterol diet. - Lipid panel; Future  3. Psoriasis of scalp Will follow with derm tomorrow.   4. Chronic pain of left knee This has improved.  We will continue to monitor.   Return in about 6 months (around 09/23/2019) for Chronic condition visit.  Micheline Rough, MD

## 2019-04-22 ENCOUNTER — Other Ambulatory Visit: Payer: Self-pay | Admitting: General Surgery

## 2019-04-22 DIAGNOSIS — C4371 Malignant melanoma of right lower limb, including hip: Secondary | ICD-10-CM

## 2019-05-03 NOTE — Pre-Procedure Instructions (Signed)
Lupie Betschart  05/03/2019      [facility] [address] [place], [place] [id number] [place] [id number] Phone: [phone] Fax: [phone]    Your procedure is scheduled on May 10, 2019.  Report to [facility] Admitting at 530 AM.  Call this number if you have problems the morning of surgery:  [phone]   Remember:  Do not eat or drink after midnight.  You may drink clear liquids until 430 AM .  Clear liquids allowed are:  Water, Juice (non-citric and without pulp), Clear Tea, Black Coffee only and Gatorade    Take these medicines the morning of surgery with A SIP OF WATER  None  7 days prior to surgery STOP taking any Aspirin (unless otherwise instructed by your surgeon), Aleve, Naproxen, Ibuprofen, Motrin, Advil, Goody's, BC's, all herbal medications, fish oil, and all vitamins   Day of Surgery:  Do not wear jewelry, make-up or nail polish.  Do not wear lotions, powders, or perfumes, or deodorant.  Do not shave 48 hours prior to surgery.    Do not bring valuables to the hospital.  [facility] is not responsible for any belongings or valuables.  Contacts, dentures or bridgework may not be worn into surgery.  Leave your suitcase in the car.  After surgery it may be brought to your room.  For patients admitted to the hospital, discharge time will be determined by your treatment team.  Patients discharged the day of surgery will not be allowed to drive home.    [facility]- Preparing For Surgery  Before surgery, you can play an important role. Because skin is not sterile, your skin needs to be as free of germs as possible. You can reduce the number of germs on your skin by washing with CHG (chlorahexidine gluconate) Soap before surgery.  CHG is an antiseptic cleaner which kills germs and bonds with the skin to continue killing germs even after washing.    Oral Hygiene is also important to reduce your risk of infection.   Remember - BRUSH YOUR TEETH THE MORNING OF SURGERY WITH YOUR REGULAR TOOTHPASTE  Please do not use if you have an allergy to CHG or antibacterial soaps. If your skin becomes reddened/irritated stop using the CHG.  Do not shave (including legs and underarms) for at least 48 hours prior to first CHG shower. It is OK to shave your face.  Please follow these instructions carefully.   1. Shower the NIGHT BEFORE SURGERY and the MORNING OF SURGERY with CHG.   2. If you chose to wash your hair, wash your hair first as usual with your normal shampoo.  3. After you shampoo, rinse your hair and body thoroughly to remove the shampoo.  4. Use CHG as you would any other liquid soap. You can apply CHG directly to the skin and wash gently with a scrungie or a clean washcloth.   5. Apply the CHG Soap to your body ONLY FROM THE NECK DOWN.  Do not use on open wounds or open sores. Avoid contact with your eyes, ears, mouth and genitals (private parts). Wash Face and genitals (private parts)  with your normal soap.  6. Wash thoroughly, paying special attention to the area where your surgery will be performed.  7. Thoroughly rinse your body with warm water from the neck down.  8. DO NOT shower/wash with your normal soap after using and rinsing off the CHG Soap.  9.  Pat yourself dry with a CLEAN TOWEL.  10. Wear CLEAN PAJAMAS to bed the night before surgery, wear comfortable clothes the morning of surgery  11. Place CLEAN SHEETS on your bed the night of your first shower and DO NOT SLEEP WITH PETS.  Day of Surgery: Shower as above Do not apply any deodorants/lotions.  Please wear clean clothes to the hospital/surgery center.   Remember to brush your teeth WITH YOUR REGULAR TOOTHPASTE.   Please read over the following fact sheets that you were given.

## 2019-05-04 ENCOUNTER — Encounter (HOSPITAL_COMMUNITY)
Admission: RE | Admit: 2019-05-04 | Discharge: 2019-05-04 | Disposition: A | Discharge status: Home / Self Care | Payer: 59 | Source: Ambulatory Visit | Attending: General Surgery | Admitting: General Surgery

## 2019-05-04 ENCOUNTER — Encounter (HOSPITAL_COMMUNITY): Payer: Self-pay

## 2019-05-04 ENCOUNTER — Other Ambulatory Visit: Payer: Self-pay

## 2019-05-04 DIAGNOSIS — Z20828 Contact with and (suspected) exposure to other viral communicable diseases: Secondary | ICD-10-CM | POA: Diagnosis not present

## 2019-05-04 DIAGNOSIS — Z01812 Encounter for preprocedural laboratory examination: Secondary | ICD-10-CM | POA: Diagnosis not present

## 2019-05-04 HISTORY — DX: Unspecified osteoarthritis, unspecified site: M19.90

## 2019-05-04 HISTORY — DX: Malignant (primary) neoplasm, unspecified: C80.1

## 2019-05-04 HISTORY — DX: Psoriasis, unspecified: L40.9

## 2019-05-04 LAB — CBC WITH DIFFERENTIAL/PLATELET
Abs Immature Granulocytes: 0 10*3/uL (ref 0.00–0.07)
Basophils Absolute: 0.1 10*3/uL (ref 0.0–0.1)
Basophils Relative: 1 %
Eosinophils Absolute: 0.2 10*3/uL (ref 0.0–0.5)
Eosinophils Relative: 6 %
HCT: 42.9 % (ref 36.0–46.0)
Hemoglobin: 13.8 g/dL (ref 12.0–15.0)
Immature Granulocytes: 0 %
Lymphocytes Relative: 38 %
Lymphs Abs: 1.5 10*3/uL (ref 0.7–4.0)
MCH: 30.4 pg (ref 26.0–34.0)
MCHC: 32.2 g/dL (ref 30.0–36.0)
MCV: 94.5 fL (ref 80.0–100.0)
Monocytes Absolute: 0.3 10*3/uL (ref 0.1–1.0)
Monocytes Relative: 8 %
Neutro Abs: 1.8 10*3/uL (ref 1.7–7.7)
Neutrophils Relative %: 47 %
Platelets: 225 10*3/uL (ref 150–400)
RBC: 4.54 MIL/uL (ref 3.87–5.11)
RDW: 12.6 % (ref 11.5–15.5)
WBC: 3.9 10*3/uL — ABNORMAL LOW (ref 4.0–10.5)
nRBC: 0 % (ref 0.0–0.2)

## 2019-05-04 LAB — COMPREHENSIVE METABOLIC PANEL
ALT: 24 U/L (ref 0–44)
AST: 20 U/L (ref 15–41)
Albumin: 4.1 g/dL (ref 3.5–5.0)
Alkaline Phosphatase: 61 U/L (ref 38–126)
Anion gap: 9 (ref 5–15)
BUN: 16 mg/dL (ref 6–20)
CO2: 24 mmol/L (ref 22–32)
Calcium: 9.3 mg/dL (ref 8.9–10.3)
Chloride: 107 mmol/L (ref 98–111)
Creatinine, Ser: 0.79 mg/dL (ref 0.44–1.00)
GFR calc Af Amer: >60 mL/min (ref >60)
GFR calc non Af Amer: >60 mL/min (ref >60)
Glucose, Bld: 98 mg/dL (ref 70–99)
Potassium: 4.1 mmol/L (ref 3.5–5.1)
Sodium: 140 mmol/L (ref 135–145)
Total Bilirubin: 0.6 mg/dL (ref 0.3–1.2)
Total Protein: 6.4 g/dL — ABNORMAL LOW (ref 6.5–8.1)

## 2019-05-04 NOTE — Pre-Procedure Instructions (Signed)
Arieanna Carrithers  05/04/2019      [facility] [address] [place], [place] [id number] [place] [id number] Phone: [phone] Fax: [phone]    Your procedure is scheduled on Tuesday, May 10, 2019.  Report to [facility] Admitting , Entrance A at 530 AM.  Call this number if you have problems the morning of surgery:  [phone]   Remember:  Do not eat after midnight Mon (8/31).  You may drink clear liquids until 430 AM Tuesday (9/1) .  Clear liquids allowed are:  Water, Juice (non-citric and without pulp), Clear Tea, Black Coffee only and Gatorade    Take these medicines the morning of surgery with A SIP OF WATER: None  STOP now taking any Aspirin (unless otherwise instructed by your surgeon), Aleve, Naproxen, Ibuprofen, Motrin, Advil, Goody's, BC's, all herbal medications, fish oil, and all vitamins   Day of Surgery:  Do not wear jewelry, make-up or nail polish.  Do not wear lotions, powders, perfumes, or deodorant.  Do not shave 48 hours prior to surgery.    Do not bring valuables to the hospital.  [facility] is not responsible for any belongings or valuables.  Contacts, dentures or bridgework may not be worn into surgery.    For patients admitted to the hospital, discharge time will be determined by your treatment team.  Patients discharged the day of surgery will not be allowed to drive home.    [facility]- Preparing For Surgery  Before surgery, you can play an important role. Because skin is not sterile, your skin needs to be as free of germs as possible. You can reduce the number of germs on your skin by washing with CHG (chlorahexidine gluconate) Soap before surgery.  CHG is an antiseptic cleaner which kills germs and bonds with the skin to continue killing germs even after washing.    Oral Hygiene is also important to reduce your risk of infection.  Remember - BRUSH YOUR TEETH THE MORNING OF SURGERY WITH YOUR  REGULAR TOOTHPASTE  Please do not use if you have an allergy to CHG or antibacterial soaps. If your skin becomes reddened/irritated stop using the CHG.  Do not shave (including legs and underarms) for at least 48 hours prior to first CHG shower. It is OK to shave your face.  Please follow these instructions carefully.   1. Shower the NIGHT BEFORE SURGERY Mon and the MORNING OF SURGERY Tues with CHG.   2. If you chose to wash your hair, wash your hair first as usual with your normal shampoo.  3. After you shampoo, rinse your hair and body thoroughly to remove the shampoo.  4. Use CHG as you would any other liquid soap. You can apply CHG directly to the skin and wash gently with a scrungie or a clean washcloth.   5. Apply the CHG Soap to your body ONLY FROM THE NECK DOWN.  Do not use on open wounds or open sores. Avoid contact with your eyes, ears, mouth and genitals (private parts). Wash Face and genitals (private parts)  with your normal soap.  6. Wash thoroughly, paying special attention to the area where your surgery will be performed.  7. Thoroughly rinse your body with warm water from the neck down.  8. DO NOT shower/wash with your normal soap after using and rinsing off the CHG Soap.  9. Pat yourself dry with a CLEAN TOWEL.  10. Wear CLEAN PAJAMAS to bed the  night before surgery, wear comfortable clothes the morning of surgery  11. Place CLEAN SHEETS on your bed the night of your first shower and DO NOT SLEEP WITH PETS.  Day of Surgery: Shower as above Do not apply any deodorants/lotions.  Please wear clean clothes to the hospital/surgery center.   Remember to brush your teeth WITH YOUR REGULAR TOOTHPASTE.  Please read over the following fact sheets that you were given.

## 2019-05-04 NOTE — Progress Notes (Signed)
Patient denies shortness of breath, fever, cough and chest pain at PAT appointment  PCP - Dr Micheline Rough Cardiologist - Denies  Chest x-ray - Denies EKG - Denies Stress Test - Denies ECHO - Denies  Cardiac Cath - Denies  ERAS: Clears til 4:30 am DOS, no drink..  Anesthesia review: No  STOP now taking any Aspirin (unless otherwise instructed by your surgeon), Aleve, Naproxen, Ibuprofen, Motrin, Advil, Goody's, BC's, all herbal medications, fish oil, and all vitamins.   Coronavirus Screening Have you or your husband experienced the following symptoms:  Cough yes/no: No Fever (>100.33F)  yes/no: No Runny nose yes/no: No Sore throat yes/no: No Difficulty breathing/shortness of breath  yes/no: No  Have you or your husband traveled in the last 14 days and where? yes/no: No

## 2019-05-06 ENCOUNTER — Other Ambulatory Visit (HOSPITAL_COMMUNITY)
Admission: RE | Admit: 2019-05-06 | Discharge: 2019-05-06 | Disposition: A | Discharge status: Home / Self Care | Payer: 59 | Source: Ambulatory Visit | Attending: General Surgery | Admitting: General Surgery

## 2019-05-06 DIAGNOSIS — Z01812 Encounter for preprocedural laboratory examination: Secondary | ICD-10-CM | POA: Diagnosis not present

## 2019-05-06 LAB — SARS CORONAVIRUS 2 (TAT 6-24 HRS): SARS Coronavirus 2: NEGATIVE

## 2019-05-09 NOTE — H&P (Signed)
Makayla King Documented: 04/22/2019 9:53 AM Location: [facility] Patient #: [id number] DOB: 1966-03-14 Married / Language: Makayla King / Race: White Female   History of Present Illness Makayla Klein MD; 04/22/2019 11:29 AM) The patient is a 53 year old female who presents with malignant melanoma. Pt is a 53 yo F referred for consultation by Dr. Martin King for a diagnosis of melanoma from 03/2019. She had a pigmented lesion on her right lower leg for many years, but one day she cut it shaving and noted that it stuck out a bit. She also had several friends/family members note that it was concerning. She went to get it checked out and it was found to be a malignant melanoma, superficial spreading type. It was 0.9 mm, but had very close margins and 7 mitotic figures/mm sq. No ulceration, LVI, neurotropism, or satellitosis was seen. This was clark's level 3. She has no personal or family history of cancer. She is healthy and walks 3 miles per day and plays tennis several times per week.    Past Surgical History Makayla King, [place]; 04/22/2019 9:53 AM) Appendectomy   Diagnostic Studies History Makayla King, CMA; 04/22/2019 9:53 AM) Colonoscopy  within last year Mammogram  within last year Pap Smear  >5 years ago  Allergies Makayla King, CMA; 04/22/2019 9:54 AM) No Known Allergies  [04/22/2019]: No Known Drug Allergies  [04/22/2019]: Allergies Reconciled   Medication History Makayla King, CMA; 04/22/2019 9:54 AM) No Current Medications Medications Reconciled  Social History Makayla King, CMA; 04/22/2019 9:53 AM) Alcohol use  Moderate alcohol use. Caffeine use  Carbonated beverages, Coffee, Tea. No drug use  Tobacco use  Never smoker.  Family History Makayla King, [place]; 04/22/2019 9:53 AM) Colon Cancer  Father. Hypertension  Father, Mother.  Pregnancy / Birth History Makayla King, [place]; 04/22/2019 9:53 AM) Age at menarche  84 years. Age of menopause   [age] Contraceptive History  Oral contraceptives. Gravida  2 Irregular periods  Length (months) of breastfeeding  3-6 Maternal age  47-30  Other Problems Makayla King, CMA; 04/22/2019 9:53 AM) No pertinent past medical history     Review of Systems (Makayla King; 04/22/2019 9:53 AM) General Not Present- Appetite Loss, Chills, Fatigue, Fever, Night Sweats, Weight Gain and Weight Loss. Skin Not Present- Change in Wart/Mole, Dryness, Hives, Jaundice, New Lesions, Non-Healing Wounds, Rash and Ulcer. HEENT Not Present- Earache, Hearing Loss, Hoarseness, Nose Bleed, Oral Ulcers, Ringing in the Ears, Seasonal Allergies, Sinus Pain, Sore Throat, Visual Disturbances, Wears glasses/contact lenses and Yellow Eyes. Breast Not Present- Breast Mass, Breast Pain, Nipple Discharge and Skin Changes. Cardiovascular Not Present- Chest Pain, Difficulty Breathing Lying Down, Leg Cramps, Palpitations, Rapid Heart Rate, Shortness of Breath and Swelling of Extremities. Female Genitourinary Not Present- Frequency, Nocturia, Painful Urination, Pelvic Pain and Urgency. Musculoskeletal Not Present- Back Pain, Joint Pain, Joint Stiffness, Muscle Pain, Muscle Weakness and Swelling of Extremities. Neurological Not Present- Decreased Memory, Fainting, Headaches, Numbness, Seizures, Tingling, Tremor, Trouble walking and Weakness. Psychiatric Not Present- Anxiety, Bipolar, Change in Sleep Pattern, Depression, Fearful and Frequent crying. Endocrine Not Present- Cold Intolerance, Excessive Hunger, Hair Changes, Heat Intolerance, Hot flashes and New Diabetes. Hematology Not Present- Blood Thinners, Easy Bruising, Excessive bleeding, Gland problems, HIV and Persistent Infections.  Vitals (Makayla King CMA; 04/22/2019 9:55 AM) 04/22/2019 9:54 AM Weight: 109.2 lb Height: 61in Body Surface Area: 1.46 m Body Mass Index: 20.63 kg/m  Temp.: 97.72F (Temporal)  Pulse: 98 (Regular)  BP: 118/64(Sitting, Left  Arm, Standard)  Physical Exam Makayla Klein MD; 04/22/2019 11:30 AM) General Mental Status-Alert. General Appearance-Consistent with stated age. Hydration-Well hydrated. Voice-Normal.  Integumentary Note: 1 cm scar on right lower leg laterally   Head and Neck Head-normocephalic, atraumatic with no lesions or palpable masses. Trachea-midline. Thyroid Gland Characteristics - normal size and consistency.  Eye Eyeball - Bilateral-Extraocular movements intact. Sclera/Conjunctiva - Bilateral-No scleral icterus.  Chest and Lung Exam Chest and lung exam reveals -quiet, even and easy respiratory effort with no use of accessory muscles and on auscultation, normal breath sounds, no adventitious sounds and normal vocal resonance. Inspection Chest Wall - Normal. Back - normal.  Cardiovascular Cardiovascular examination reveals -normal heart sounds, regular rate and rhythm with no murmurs and normal pedal pulses bilaterally.  Abdomen Inspection Inspection of the abdomen reveals - No Hernias. Palpation/Percussion Palpation and Percussion of the abdomen reveal - Soft, Non Tender, No Rebound tenderness, No Rigidity (guarding) and No hepatosplenomegaly. Auscultation Auscultation of the abdomen reveals - Bowel sounds normal.  Neurologic Neurologic evaluation reveals -alert and oriented x 3 with no impairment of recent or remote memory. Mental Status-Normal.  Musculoskeletal Global Assessment -Note: no gross deformities.  Normal Exam - Left-Upper Extremity Strength Normal and Lower Extremity Strength Normal. Normal Exam - Right-Upper Extremity Strength Normal and Lower Extremity Strength Normal.  Lymphatic Head & Neck  General Head & Neck Lymphatics: Bilateral - Description - Normal. Axillary  General Axillary Region: Bilateral - Description - Normal. Tenderness - Non Tender. Femoral & Inguinal  Generalized Femoral & Inguinal Lymphatics:  Bilateral - Description - No Generalized lymphadenopathy.    Assessment & Plan Makayla Klein MD; 04/22/2019 11:32 AM)  MALIGNANT MELANOMA OF RIGHT LOWER LEG (C43.71) Impression: We had a long discussion about whether to do sentinel node biopsy. I would recommend it given her young age, close margins and high mitotic rate in the pathologic specimen. This information would have dramatic consequences if it were positive.  I discussed procedure, incision, and risks. I also reviewed expectations for post op and recovery. I reviewed numbness and tightness on calf. I discussed risk of prolonged seroma in groin. I also discussed post op recovery. She will have a harder time given how active she is.  We will do this at the first available opportunity.  Current Plans Pt Education - Melanoma: skin cancer You are being scheduled for surgery- Our schedulers will call you.  You should hear from our office's scheduling department within 5 working days about the location, date, and time of surgery. We try to make accommodations for patient's preferences in scheduling surgery, but sometimes the OR schedule or the surgeon's schedule prevents [place] from making those accommodations.  If you have not heard from our office [phone]) in 5 working days, call the office and ask for your surgeon's nurse.  If you have other questions about your diagnosis, plan, or surgery, call the office and ask for your surgeon's nurse.    Signed by Makayla Klein, MD (04/22/2019 11:32 AM)

## 2019-05-10 ENCOUNTER — Encounter (HOSPITAL_COMMUNITY)
Admission: RE | Disposition: A | Discharge status: Home / Self Care | Payer: Self-pay | Source: Home / Self Care | Attending: General Surgery

## 2019-05-10 ENCOUNTER — Encounter (HOSPITAL_COMMUNITY): Payer: Self-pay | Admitting: Certified Registered Nurse Anesthetist

## 2019-05-10 ENCOUNTER — Ambulatory Visit (HOSPITAL_COMMUNITY): Payer: 59 | Admitting: Certified Registered Nurse Anesthetist

## 2019-05-10 ENCOUNTER — Other Ambulatory Visit: Payer: Self-pay

## 2019-05-10 ENCOUNTER — Ambulatory Visit (HOSPITAL_COMMUNITY)
Admission: RE | Admit: 2019-05-10 | Discharge: 2019-05-10 | Disposition: A | Discharge status: Home / Self Care | Payer: 59 | Source: Ambulatory Visit | Attending: General Surgery | Admitting: General Surgery

## 2019-05-10 ENCOUNTER — Ambulatory Visit (HOSPITAL_COMMUNITY)
Admission: RE | Admit: 2019-05-10 | Discharge: 2019-05-10 | Disposition: A | Discharge status: Home / Self Care | Payer: 59 | Attending: General Surgery | Admitting: General Surgery

## 2019-05-10 DIAGNOSIS — C4362 Malignant melanoma of left upper limb, including shoulder: Secondary | ICD-10-CM | POA: Insufficient documentation

## 2019-05-10 DIAGNOSIS — C4371 Malignant melanoma of right lower limb, including hip: Secondary | ICD-10-CM | POA: Diagnosis not present

## 2019-05-10 HISTORY — PX: SKIN BIOPSY: SHX1

## 2019-05-10 HISTORY — PX: MELANOMA EXCISION WITH SENTINEL LYMPH NODE BIOPSY: SHX5267

## 2019-05-10 SURGERY — MELANOMA EXCISION WITH SENTINEL LYMPH NODE BIOPSY
Anesthesia: General | Site: Arm Upper | Laterality: Right

## 2019-05-10 MED ORDER — OXYCODONE HCL 5 MG PO TABS: 2.5000 mg | ORAL_TABLET | ORAL | 0 refills | Status: DC | PRN

## 2019-05-10 MED ORDER — EPHEDRINE SULFATE-NACL 50-0.9 MG/10ML-% IV SOSY
PREFILLED_SYRINGE | INTRAVENOUS | Status: DC | PRN
  Administered 2019-05-10 (×3): 10 mg via INTRAVENOUS
  Administered 2019-05-10: 5 mg via INTRAVENOUS

## 2019-05-10 MED ORDER — ONDANSETRON HCL 4 MG/2ML IJ SOLN
INTRAMUSCULAR | Status: AC
  Filled 2019-05-10: qty 2

## 2019-05-10 MED ORDER — GABAPENTIN 300 MG PO CAPS
300.0000 mg | ORAL_CAPSULE | ORAL | Status: AC
  Administered 2019-05-10: 300 mg via ORAL
  Filled 2019-05-10: qty 1

## 2019-05-10 MED ORDER — DEXAMETHASONE SODIUM PHOSPHATE 10 MG/ML IJ SOLN
INTRAMUSCULAR | Status: AC
  Filled 2019-05-10: qty 1

## 2019-05-10 MED ORDER — LACTATED RINGERS IV SOLN
INTRAVENOUS | Status: DC | PRN
  Administered 2019-05-10: 07:00:00 via INTRAVENOUS

## 2019-05-10 MED ORDER — ACETAMINOPHEN 500 MG PO TABS
1000.0000 mg | ORAL_TABLET | ORAL | Status: AC
  Administered 2019-05-10: 1000 mg via ORAL
  Filled 2019-05-10: qty 2

## 2019-05-10 MED ORDER — LIDOCAINE HCL (PF) 1 % IJ SOLN
INTRAMUSCULAR | Status: AC
  Filled 2019-05-10: qty 30

## 2019-05-10 MED ORDER — FENTANYL CITRATE (PF) 100 MCG/2ML IJ SOLN: 25.0000 ug | INTRAMUSCULAR | Status: DC | PRN

## 2019-05-10 MED ORDER — PROPOFOL 10 MG/ML IV BOLUS
INTRAVENOUS | Status: DC | PRN
  Administered 2019-05-10: 120 mg via INTRAVENOUS
  Administered 2019-05-10: 50 mg via INTRAVENOUS

## 2019-05-10 MED ORDER — LIDOCAINE HCL 1 % IJ SOLN
INTRAMUSCULAR | Status: AC
  Filled 2019-05-10: qty 20

## 2019-05-10 MED ORDER — LIDOCAINE 2% (20 MG/ML) 5 ML SYRINGE
INTRAMUSCULAR | Status: AC
  Filled 2019-05-10: qty 5

## 2019-05-10 MED ORDER — LIDOCAINE 2% (20 MG/ML) 5 ML SYRINGE
INTRAMUSCULAR | Status: DC | PRN
  Administered 2019-05-10: 100 mg via INTRAVENOUS

## 2019-05-10 MED ORDER — BUPIVACAINE-EPINEPHRINE 0.25% -1:200000 IJ SOLN
INTRAMUSCULAR | Status: DC | PRN
  Administered 2019-05-10: 30 mL

## 2019-05-10 MED ORDER — PROPOFOL 10 MG/ML IV BOLUS
INTRAVENOUS | Status: AC
  Filled 2019-05-10: qty 40

## 2019-05-10 MED ORDER — METHYLENE BLUE 0.5 % INJ SOLN
INTRAVENOUS | Status: AC
  Filled 2019-05-10: qty 10

## 2019-05-10 MED ORDER — METHYLENE BLUE 1 % INJ SOLN
INTRAMUSCULAR | Status: DC | PRN
  Administered 2019-05-10: 1 mL via SUBMUCOSAL

## 2019-05-10 MED ORDER — 0.9 % SODIUM CHLORIDE (POUR BTL) OPTIME
TOPICAL | Status: DC | PRN
  Administered 2019-05-10: 1000 mL

## 2019-05-10 MED ORDER — FENTANYL CITRATE (PF) 100 MCG/2ML IJ SOLN
INTRAMUSCULAR | Status: DC | PRN
  Administered 2019-05-10 (×2): 50 ug via INTRAVENOUS

## 2019-05-10 MED ORDER — CHLORHEXIDINE GLUCONATE CLOTH 2 % EX PADS: 6.0000 | MEDICATED_PAD | Freq: Once | CUTANEOUS | Status: DC

## 2019-05-10 MED ORDER — DEXAMETHASONE SODIUM PHOSPHATE 10 MG/ML IJ SOLN
INTRAMUSCULAR | Status: DC | PRN
  Administered 2019-05-10: 5 mg via INTRAVENOUS

## 2019-05-10 MED ORDER — MIDAZOLAM HCL 2 MG/2ML IJ SOLN
INTRAMUSCULAR | Status: AC
  Filled 2019-05-10: qty 2

## 2019-05-10 MED ORDER — TECHNETIUM TC 99M SULFUR COLLOID FILTERED
0.5000 | Freq: Once | INTRAVENOUS | Status: AC | PRN
  Administered 2019-05-10: 0.5 via INTRADERMAL

## 2019-05-10 MED ORDER — LIDOCAINE HCL 1 % IJ SOLN
INTRAMUSCULAR | Status: DC | PRN
  Administered 2019-05-10: 30 mL

## 2019-05-10 MED ORDER — BUPIVACAINE-EPINEPHRINE (PF) 0.25% -1:200000 IJ SOLN
INTRAMUSCULAR | Status: AC
  Filled 2019-05-10: qty 30

## 2019-05-10 MED ORDER — ONDANSETRON HCL 4 MG/2ML IJ SOLN
INTRAMUSCULAR | Status: DC | PRN
  Administered 2019-05-10: 4 mg via INTRAVENOUS

## 2019-05-10 MED ORDER — FENTANYL CITRATE (PF) 250 MCG/5ML IJ SOLN
INTRAMUSCULAR | Status: AC
  Filled 2019-05-10: qty 5

## 2019-05-10 MED ORDER — CEFAZOLIN SODIUM-DEXTROSE 2-4 GM/100ML-% IV SOLN
2.0000 g | INTRAVENOUS | Status: AC
  Administered 2019-05-10: 08:00:00 2 g via INTRAVENOUS
  Filled 2019-05-10: qty 100

## 2019-05-10 MED ORDER — MIDAZOLAM HCL 5 MG/5ML IJ SOLN
INTRAMUSCULAR | Status: DC | PRN
  Administered 2019-05-10: 2 mg via INTRAVENOUS

## 2019-05-10 SURGICAL SUPPLY — 57 items
BENZOIN TINCTURE PRP APPL 2/3 (GAUZE/BANDAGES/DRESSINGS) ×8 IMPLANT
BLADE SURG 10 STRL SS (BLADE) ×4 IMPLANT
BNDG COHESIVE 4X5 TAN STRL (GAUZE/BANDAGES/DRESSINGS) IMPLANT
BNDG GAUZE ELAST 4 BULKY (GAUZE/BANDAGES/DRESSINGS) ×4 IMPLANT
CANISTER SUCT 3000ML PPV (MISCELLANEOUS) ×4 IMPLANT
CHLORAPREP W/TINT 26 (MISCELLANEOUS) ×4 IMPLANT
CLIP VESOCCLUDE MED 24/CT (CLIP) ×4 IMPLANT
CLIP VESOCCLUDE SM WIDE 24/CT (CLIP) ×4 IMPLANT
CLOSURE STERI-STRIP 1/4X4 (GAUZE/BANDAGES/DRESSINGS) ×8 IMPLANT
CLOSURE WOUND 1/2 X4 (GAUZE/BANDAGES/DRESSINGS) ×2
CONT SPEC 4OZ CLIKSEAL STRL BL (MISCELLANEOUS) ×12 IMPLANT
COVER MAYO STAND STRL (DRAPES) IMPLANT
COVER PROBE W GEL 5X96 (DRAPES) ×4 IMPLANT
COVER SURGICAL LIGHT HANDLE (MISCELLANEOUS) ×4 IMPLANT
COVER WAND RF STERILE (DRAPES) ×4 IMPLANT
DECANTER SPIKE VIAL GLASS SM (MISCELLANEOUS) ×4 IMPLANT
DERMABOND ADVANCED (GAUZE/BANDAGES/DRESSINGS) ×2
DERMABOND ADVANCED .7 DNX12 (GAUZE/BANDAGES/DRESSINGS) ×2 IMPLANT
DRAPE HALF SHEET 40X57 (DRAPES) ×4 IMPLANT
DRAPE LAPAROSCOPIC ABDOMINAL (DRAPES) ×4 IMPLANT
DRSG TEGADERM 4X4.75 (GAUZE/BANDAGES/DRESSINGS) ×8 IMPLANT
ELECT REM PT RETURN 9FT ADLT (ELECTROSURGICAL) ×4
ELECTRODE REM PT RTRN 9FT ADLT (ELECTROSURGICAL) ×2 IMPLANT
GAUZE SPONGE 2X2 8PLY STRL LF (GAUZE/BANDAGES/DRESSINGS) ×2 IMPLANT
GLOVE BIO SURGEON STRL SZ 6 (GLOVE) ×4 IMPLANT
GLOVE INDICATOR 6.5 STRL GRN (GLOVE) ×4 IMPLANT
GOWN STRL REUS W/ TWL LRG LVL3 (GOWN DISPOSABLE) ×4 IMPLANT
GOWN STRL REUS W/TWL 2XL LVL3 (GOWN DISPOSABLE) ×8 IMPLANT
GOWN STRL REUS W/TWL LRG LVL3 (GOWN DISPOSABLE) ×4
KIT BASIN OR (CUSTOM PROCEDURE TRAY) ×4 IMPLANT
KIT TURNOVER KIT B (KITS) ×4 IMPLANT
MARKER SKIN DUAL TIP RULER LAB (MISCELLANEOUS) ×4 IMPLANT
NEEDLE 18GX1X1/2 (RX/OR ONLY) (NEEDLE) ×4 IMPLANT
NEEDLE FILTER BLUNT 18X 1/2SAF (NEEDLE)
NEEDLE FILTER BLUNT 18X1 1/2 (NEEDLE) IMPLANT
NEEDLE HYPO 25GX1X1/2 BEV (NEEDLE) ×8 IMPLANT
NS IRRIG 1000ML POUR BTL (IV SOLUTION) ×4 IMPLANT
PACK GENERAL/GYN (CUSTOM PROCEDURE TRAY) ×4 IMPLANT
PACK UNIVERSAL I (CUSTOM PROCEDURE TRAY) IMPLANT
PAD ARMBOARD 7.5X6 YLW CONV (MISCELLANEOUS) ×8 IMPLANT
PENCIL SMOKE EVACUATOR (MISCELLANEOUS) ×4 IMPLANT
PUNCH BIOPSY DERMAL 6MM STRL (MISCELLANEOUS) ×4 IMPLANT
SPECIMEN JAR MEDIUM (MISCELLANEOUS) ×4 IMPLANT
SPONGE GAUZE 2X2 STER 10/PKG (GAUZE/BANDAGES/DRESSINGS) ×2
STOCKINETTE IMPERVIOUS 9X36 MD (GAUZE/BANDAGES/DRESSINGS) ×4 IMPLANT
STRIP CLOSURE SKIN 1/2X4 (GAUZE/BANDAGES/DRESSINGS) ×6 IMPLANT
SUT ETHILON 2 0 FS 18 (SUTURE) ×8 IMPLANT
SUT MNCRL AB 4-0 PS2 18 (SUTURE) ×8 IMPLANT
SUT SILK 4 0 RB 1 (SUTURE) ×4 IMPLANT
SUT VIC AB 2-0 SH 27 (SUTURE) ×6
SUT VIC AB 2-0 SH 27XBRD (SUTURE) ×6 IMPLANT
SUT VIC AB 3-0 SH 27 (SUTURE) ×4
SUT VIC AB 3-0 SH 27X BRD (SUTURE) ×4 IMPLANT
SUT VICRYL 4-0 PS2 18IN ABS (SUTURE) ×8 IMPLANT
SYR CONTROL 10ML LL (SYRINGE) ×8 IMPLANT
TOWEL GREEN STERILE (TOWEL DISPOSABLE) ×4 IMPLANT
TOWEL GREEN STERILE FF (TOWEL DISPOSABLE) ×4 IMPLANT

## 2019-05-10 NOTE — Anesthesia Preprocedure Evaluation (Addendum)
Anesthesia Evaluation  Patient identified by MRN, date of birth, ID band Patient awake    Airway Mallampati: I  TM Distance: >3 FB     Dental  (+) Teeth Intact, Dental Advisory Given   Pulmonary    breath sounds clear to auscultation       Cardiovascular negative cardio ROS   Rhythm:Regular Rate:Normal     Neuro/Psych    GI/Hepatic negative GI ROS, Neg liver ROS,   Endo/Other  negative endocrine ROS  Renal/GU negative Renal ROS     Musculoskeletal   Abdominal   Peds  Hematology   Anesthesia Other Findings   Reproductive/Obstetrics                            Anesthesia Physical Anesthesia Plan  ASA: II  Anesthesia Plan: General LMA and General   Post-op Pain Management:    Induction: Intravenous  PONV Risk Score and Plan: 1 and Ondansetron, Dexamethasone and Treatment may vary due to age or medical condition  Airway Management Planned: LMA  Additional Equipment:   Intra-op Plan:   Post-operative Plan: Extubation in OR  Informed Consent: I have reviewed the patients History and Physical, chart, labs and discussed the procedure including the risks, benefits and alternatives for the proposed anesthesia with the patient or authorized representative who has indicated his/her understanding and acceptance.     Dental Advisory Given  Plan Discussed with: CRNA and Anesthesiologist  Anesthesia Plan Comments:        Anesthesia Quick Evaluation

## 2019-05-10 NOTE — Anesthesia Procedure Notes (Signed)
Procedure Name: LMA Insertion Date/Time: 05/10/2019 7:29 AM Performed by: Colin Benton, CRNA Pre-anesthesia Checklist: Patient identified, Emergency Drugs available, Suction available and Patient being monitored Patient Re-evaluated:Patient Re-evaluated prior to induction Oxygen Delivery Method: Circle system utilized Preoxygenation: Pre-oxygenation with 100% oxygen Induction Type: IV induction Ventilation: Mask ventilation without difficulty LMA: LMA inserted LMA Size: 3.0 Number of attempts: 1 Placement Confirmation: positive ETCO2 and breath sounds checked- equal and bilateral Tube secured with: Tape Dental Injury: Teeth and Oropharynx as per pre-operative assessment

## 2019-05-10 NOTE — Interval H&P Note (Signed)
History and Physical Interval Note:  05/10/2019 6:45 AM  Makayla King  has presented today for surgery, with the diagnosis of right lower leg melanoma.  The various methods of treatment have been discussed with the patient and family. After consideration of risks, benefits and other options for treatment, the patient has consented to  Procedure(s): WIDE LOCAL EXCISION, ADVANCEMENT FLAP CLOSURE, RIGHT LOWER LEG MELANOMA EXCISION WITH SENTINEL LYMPH NODE BIOPSY (Right) as a surgical intervention.  The patient's history has been reviewed, patient examined, no change in status, stable for surgery.  I have reviewed the patient's chart and labs.  Questions were answered to the patient's satisfaction.     [name]

## 2019-05-10 NOTE — Op Note (Addendum)
PRE-OPERATIVE DIAGNOSIS: cT1bN0 right lower leg melanoma, left upper arm skin lesion  POST-OPERATIVE DIAGNOSIS:  Same  PROCEDURE:  Procedure(s): Wide local excision 1 cm margins, advancement flap closure for defect 5.3 x 3.4 cm, right inguinal sentinel lymph node mapping and biopsy, punch biopsy left upper arm.    SURGEON:  Surgeon(s): [name], MD  ASSIST: Ewell Poe, RNFA  ANESTHESIA:   local and general  DRAINS: none   LOCAL MEDICATIONS USED:  MARCAINE    and XYLOCAINE   SPECIMEN:  WLE right lower leg melanoma, SLN x 2, both hot and blue.  One right upper thigh, one right groin.    FINDINGS:  No gross residual disease.  Two sentinel nodes.    DISPOSITION OF SPECIMEN:  PATHOLOGY  COUNTS:  YES  PLAN OF CARE: Discharge to home after PACU  PATIENT DISPOSITION:  PACU - hemodynamically stable.    PROCEDURE:   Pt was identified in the holding area, taken to the OR, and placed supine on the OR table.  General anesthesia was induced.  Time out was performed according to the surgical safety checklist.  When all was correct, we continued.  One mL methylene blue was injected intradermally around the melanoma biopsy site.    The patient's right leg and groin were prepped and draped in sterile fashion.   The melanoma was identified and 1 cm margins were marked out.  Local was administered under the melanoma and the adjacent tissue.  A #10 blade was used to incise the skin around the melanoma.  The cautery was used to take the dissection down to the fascia.  The skin was marked in situ with silk suture.  The cautery was used to take the specimen off the fascia, and it was passed off the table.    Skin hooks were used to elevate the edges of the incision and the skin was freed up in all directions.  This was pulled together in a vertical orientation.  The skin was pulled together.  Deep interrupted 2-0 vicryl sutures were placed to relieve tension.  The skin was then reapproximated  with 3-0 interrupted vicryl deep dermal sutures and 4-0 monocryl running subcuticular sutures.  Three 2-0 nylon horizontal mattress sutures were placed as well.  The wound was dressed with Benzoin, steristrips, gauze, and tegaderm.    The point of maximum signal intensity was identified with the neoprobe in the groin.  A 3 cm incision was made with a #15 blade.  The subcutaneous tissues were divided with the cautery.  A Weitlaner retractor was used to assist with visualization.  The tonsil clamp was used to bluntly dissect the subcutaneous tissue.  Two sentinel lymph nodes were identified as described above. One was above the inguinal ligament and one below.  The lymphovascular channels were clipped with hemoclips.  The nodes were passed off as specimens.  Hemostasis was achieved with the cautery.  The groin was irrigated and closed with 3-0 Vicryl deep dermal interrupted sutures and 4-0 Monocryl running subcuticular suture.  This was cleaned, dried, and dressed with dermabond.  The patient also had a small skin lesion on her left upper arm.  This was prepped with betadine and a 6 mm punch biopsy was used to excise the lesion.  This was closed with a subcuticular monocryl.  Pressure was held for hemostasis.  The wound was dressed with steristrips.    Needle, sponge, and instrument counts were correct.  The patient was awakened from anesthesia and taken to  the PACU in stable condition.

## 2019-05-10 NOTE — Transfer of Care (Signed)
Immediate Anesthesia Transfer of Care Note  Patient: Makayla King  Procedure(s) Performed: WIDE LOCAL EXCISION, ADVANCEMENT FLAP CLOSURE, RIGHT LOWER LEG MELANOMA EXCISION WITH SENTINEL LYMPH NODE BIOPSY (Right ) Biopsy Skin (Left Arm Upper)  Patient Location: PACU  Anesthesia Type:General  Level of Consciousness: awake, alert , oriented and patient cooperative  Airway & Oxygen Therapy: Patient Spontanous Breathing and Patient connected to nasal cannula oxygen  Post-op Assessment: Report given to RN, Post -op Vital signs reviewed and stable and Patient moving all extremities X 4  Post vital signs: Reviewed and stable  Last Vitals:  Vitals Value Taken Time  BP 120/75 05/10/19 0930  Temp    Pulse 82 05/10/19 0931  Resp 18 05/10/19 0931  SpO2 99 % 05/10/19 0931  Vitals shown include unvalidated device data.  Last Pain:  Vitals:   05/10/19 0611  TempSrc:   PainSc: 0-No pain         Complications: No apparent anesthesia complications

## 2019-05-10 NOTE — Discharge Instructions (Addendum)
[place] Office Phone Number [phone]   POST OP INSTRUCTIONS  Always review your discharge instruction sheet given to you by the facility where your surgery was performed.  IF YOU HAVE DISABILITY OR FAMILY LEAVE FORMS, YOU MUST BRING THEM TO THE OFFICE FOR PROCESSING.  DO NOT GIVE THEM TO YOUR DOCTOR.  1. A prescription for pain medication may be given to you upon discharge.  Take your pain medication as prescribed, if needed.  If narcotic pain medicine is not needed, then you may take acetaminophen (Tylenol) or ibuprofen (Advil) as needed. 2. Take your usually prescribed medications unless otherwise directed 3. If you need a refill on your pain medication, please contact your pharmacy.  They will contact our office to request authorization.  Prescriptions will not be filled after 5pm or on week-ends. 4. You should eat very light the first 24 hours after surgery, such as soup, crackers, pudding, etc.  Resume your normal diet the day after surgery 5. It is common to experience some constipation if taking pain medication after surgery.  Increasing fluid intake and taking a stool softener will usually help or prevent this problem from occurring.  A mild laxative (Milk of Magnesia or Miralax) should be taken according to package directions if there are no bowel movements after 48 hours. 6. You may shower in 48 hours.  The surgical glue will flake off in 2-3 weeks.   7. ACTIVITIES:  No strenuous activity or heavy lifting for 2 weeks.   a. You may drive when you no longer are taking prescription pain medication, you can comfortably wear a seatbelt, and you can safely maneuver your car and apply brakes. b. RETURN TO WORK:  __________2-7 days as tolerated as long as no lifting.  _______________ Dennis Bast should see your doctor in the office for a follow-up appointment approximately three-four weeks after your surgery.    WHEN TO CALL YOUR DOCTOR: 1. Fever over 101.0 2. Nausea and/or  vomiting. 3. Extreme swelling or bruising. 4. Continued bleeding from incision. 5. Increased pain, redness, or drainage from the incision.  The clinic staff is available to answer your questions during regular business hours.  Please dont hesitate to call and ask to speak to one of the nurses for clinical concerns.  If you have a medical emergency, go to the nearest emergency room or call 911.  A surgeon from [facility] is always on call at the hospital.  For further questions, please visit [email or web address]

## 2019-05-11 ENCOUNTER — Encounter (HOSPITAL_COMMUNITY): Payer: Self-pay | Admitting: General Surgery

## 2019-05-11 NOTE — Anesthesia Postprocedure Evaluation (Signed)
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

## 2019-05-12 NOTE — Progress Notes (Signed)
Please let patient know all margins negative, lymph nodes negative, and left arm lesion is benign.

## 2019-08-02 ENCOUNTER — Other Ambulatory Visit: Payer: Self-pay

## 2019-08-02 DIAGNOSIS — Z20822 Contact with and (suspected) exposure to covid-19: Secondary | ICD-10-CM

## 2019-08-03 LAB — NOVEL CORONAVIRUS, NAA: SARS-CoV-2, NAA: NOT DETECTED

## 2020-03-19 ENCOUNTER — Other Ambulatory Visit: Payer: Self-pay | Admitting: Family Medicine

## 2020-03-19 DIAGNOSIS — Z1231 Encounter for screening mammogram for malignant neoplasm of breast: Secondary | ICD-10-CM

## 2020-03-23 ENCOUNTER — Encounter: Payer: 59 | Admitting: Family Medicine

## 2020-03-28 ENCOUNTER — Other Ambulatory Visit: Payer: Self-pay

## 2020-03-28 ENCOUNTER — Ambulatory Visit
Admission: RE | Admit: 2020-03-28 | Discharge: 2020-03-28 | Disposition: A | Discharge status: Home / Self Care | Payer: 59 | Source: Ambulatory Visit | Attending: Family Medicine | Admitting: Family Medicine

## 2020-03-28 DIAGNOSIS — Z1231 Encounter for screening mammogram for malignant neoplasm of breast: Secondary | ICD-10-CM

## 2020-04-30 ENCOUNTER — Encounter: Payer: 59 | Admitting: Family Medicine

## 2020-07-13 ENCOUNTER — Other Ambulatory Visit (HOSPITAL_COMMUNITY)
Admission: RE | Admit: 2020-07-13 | Discharge: 2020-07-13 | Disposition: A | Discharge status: Home / Self Care | Payer: 59 | Source: Ambulatory Visit | Attending: Family Medicine | Admitting: Family Medicine

## 2020-07-13 ENCOUNTER — Encounter: Payer: Self-pay | Admitting: Family Medicine

## 2020-07-13 ENCOUNTER — Ambulatory Visit (INDEPENDENT_AMBULATORY_CARE_PROVIDER_SITE_OTHER): Payer: 59 | Admitting: Family Medicine

## 2020-07-13 ENCOUNTER — Other Ambulatory Visit: Payer: Self-pay

## 2020-07-13 VITALS — BP 104/68 | HR 84 | Temp 98.3°F | Ht 61.0 in | Wt 111.3 lb

## 2020-07-13 DIAGNOSIS — E785 Hyperlipidemia, unspecified: Secondary | ICD-10-CM | POA: Diagnosis not present

## 2020-07-13 DIAGNOSIS — Z0000 Encounter for general adult medical examination without abnormal findings: Secondary | ICD-10-CM

## 2020-07-13 DIAGNOSIS — Z862 Personal history of diseases of the blood and blood-forming organs and certain disorders involving the immune mechanism: Secondary | ICD-10-CM | POA: Diagnosis not present

## 2020-07-13 DIAGNOSIS — Z23 Encounter for immunization: Secondary | ICD-10-CM | POA: Diagnosis not present

## 2020-07-13 NOTE — Patient Instructions (Signed)
Zoster Vaccine, Recombinant injection What is this medicine? ZOSTER VACCINE (ZOS ter vak SEEN) is used to prevent shingles in adults 53 years old and over. This vaccine is not used to treat shingles or nerve pain from shingles. This medicine may be used for other purposes; ask your health care provider or pharmacist if you have questions. COMMON BRAND NAME(S): SHINGRIX What should I tell my health care provider before I take this medicine? They need to know if you have any of these conditions:  blood disorders or disease  cancer like leukemia or lymphoma  immune system problems or therapy  an unusual or allergic reaction to vaccines, other medications, foods, dyes, or preservatives  pregnant or trying to get pregnant  breast-feeding How should I use this medicine? This vaccine is for injection in a muscle. It is given by a health care professional. Talk to your pediatrician regarding the use of this medicine in children. This medicine is not approved for use in children. Overdosage: If you think you have taken too much of this medicine contact a poison control center or emergency room at once. NOTE: This medicine is only for you. Do not share this medicine with others. What if I miss a dose? Keep appointments for follow-up (booster) doses as directed. It is important not to miss your dose. Call your doctor or health care professional if you are unable to keep an appointment. What may interact with this medicine?  medicines that suppress your immune system  medicines to treat cancer  steroid medicines like prednisone or cortisone This list may not describe all possible interactions. Give your health care provider a list of all the medicines, herbs, non-prescription drugs, or dietary supplements you use. Also tell them if you smoke, drink alcohol, or use illegal drugs. Some items may interact with your medicine. What should I watch for while using this medicine? Visit your doctor for  regular check ups. This vaccine, like all vaccines, may not fully protect everyone. What side effects may I notice from receiving this medicine? Side effects that you should report to your doctor or health care professional as soon as possible:  allergic reactions like skin rash, itching or hives, swelling of the face, lips, or tongue  breathing problems Side effects that usually do not require medical attention (report these to your doctor or health care professional if they continue or are bothersome):  chills  headache  fever  nausea, vomiting  redness, warmth, pain, swelling or itching at site where injected  tiredness This list may not describe all possible side effects. Call your doctor for medical advice about side effects. You may report side effects to FDA at 1-800-FDA-1088. Where should I keep my medicine? This vaccine is only given in a clinic, pharmacy, doctor's office, or other health care setting and will not be stored at home. NOTE: This sheet is a summary. It may not cover all possible information. If you have questions about this medicine, talk to your doctor, pharmacist, or health care provider.  2020 Elsevier/Gold Standard (2017-04-06 13:20:30)  

## 2020-07-13 NOTE — Progress Notes (Signed)
Makayla King DOB: 03-Aug-1966 Encounter date: 07/13/2020  This is a 54 y.o. female who presents for complete physical   History of present illness/Additional concerns: Last visit with me was in July/2020 a physical.  Had large melanoma taken off since she was here last. Still following up with derm and surgeon. Still doing q3 mo skin check with derm and q6 with surgeon.   Hyperlipidemia has been diet controlled.  Labs were last checked in July/2020.  Colonoscopy completed in 10/2018.  Repeat in 10 years. Mammogram completed 03/2020 and was normal.   Past Medical History:  Diagnosis Date  . Anemia   . Arthritis    left knee  . Cancer ([place])    melanoma right lower leg  . Psoriasis   . SVD (spontaneous vaginal delivery)    x 2   Past Surgical History:  Procedure Laterality Date  . APPENDECTOMY     2001  . COLONOSCOPY  10/26/2018  . DILITATION & CURRETTAGE/HYSTROSCOPY WITH NOVASURE ABLATION N/A 06/15/2013   Procedure: DILATATION & CURETTAGE/HYSTEROSCOPY WITH NOVASURE ABLATION;  Surgeon: Melina Schools, MD;  Location: [facility];  Service: Gynecology;  Laterality: N/A;  . MELANOMA EXCISION WITH SENTINEL LYMPH NODE BIOPSY Right 05/10/2019   Procedure: WIDE LOCAL EXCISION, ADVANCEMENT FLAP CLOSURE, RIGHT LOWER LEG MELANOMA EXCISION WITH SENTINEL LYMPH NODE BIOPSY;  Surgeon: [name], MD;  Location: [facility];  Service: General;  Laterality: Right;  . SKIN BIOPSY Left 05/10/2019   Procedure: Biopsy Skin;  Surgeon: [name], MD;  Location: [place];  Service: General;  Laterality: Left;   No Known Allergies Current Meds  Medication Sig  . betamethasone dipropionate 0.05 % lotion Apply topically.  . fluocinolone (SYNALAR) 0.01 % external solution Apply topically 2 (two) times daily. (Patient taking differently: Apply 1 application topically 2 (two) times daily as needed (eczema). )  . Multiple Vitamins-Minerals (MULTI VITAMIN/MINERALS) TABS Take 1 tablet by mouth daily. One Source    . Omega-3 Fatty Acids (FISH OIL OMEGA-3 PO) Take 1,200 mg by mouth daily.    Social History   Tobacco Use  . Smoking status: Never Smoker  . Smokeless tobacco: Never Used  Substance Use Topics  . Alcohol use: Yes    Alcohol/week: 4.0 standard drinks    Types: 4 Standard drinks or equivalent per week   Family History  Problem Relation Age of Onset  . High blood pressure Mother   . High blood pressure Father   . Colon cancer Father        uncertain details  . High blood pressure Sister   . Breast cancer Maternal Grandmother 80  . Diabetes Maternal Grandfather   . Heart disease Maternal Grandfather   . CAD Maternal Grandfather   . Multiple sclerosis Paternal Grandmother   . Emphysema Paternal Grandfather        smoker  . Esophageal cancer Neg Hx   . Rectal cancer Neg Hx   . Stomach cancer Neg Hx      Review of Systems  Constitutional: Negative for activity change, appetite change, chills, fatigue, fever and unexpected weight change.  HENT: Negative for congestion, ear pain, hearing loss, sinus pressure, sinus pain, sore throat and trouble swallowing.   Eyes: Negative for pain and visual disturbance.  Respiratory: Negative for cough, chest tightness, shortness of breath and wheezing.   Cardiovascular: Negative for chest pain, palpitations and leg swelling.  Gastrointestinal: Negative for abdominal pain, blood in stool, constipation, diarrhea, nausea and vomiting.  Genitourinary: Negative for  difficulty urinating and menstrual problem.  Musculoskeletal: Negative for arthralgias and back pain.  Skin: Negative for rash.  Neurological: Negative for dizziness, weakness, numbness and headaches.  Hematological: Negative for adenopathy. Does not bruise/bleed easily.  Psychiatric/Behavioral: Negative for sleep disturbance and suicidal ideas. The patient is not nervous/anxious.     CBC:  Lab Results  Component Value Date   WBC 4.7 07/13/2020   HGB 15.0 07/13/2020   HCT 44.5  07/13/2020   MCH 30.8 07/13/2020   MCHC 33.7 07/13/2020   RDW 12.3 07/13/2020   PLT 230 07/13/2020   MPV 11.3 07/13/2020   CMP: Lab Results  Component Value Date   NA 137 07/13/2020   K 4.9 07/13/2020   CL 102 07/13/2020   CO2 18 (L) 07/13/2020   ANIONGAP 9 05/04/2019   GLUCOSE 66 07/13/2020   BUN 14 07/13/2020   CREATININE 0.93 07/13/2020   GFRAA >60 05/04/2019   CALCIUM 9.6 07/13/2020   PROT 7.6 07/13/2020   BILITOT 0.4 07/13/2020   ALKPHOS 61 05/04/2019   ALT 21 07/13/2020   AST 27 07/13/2020   LIPID: Lab Results  Component Value Date   CHOL 287 (H) 07/13/2020   TRIG 215 (H) 07/13/2020   HDL 79 07/13/2020   LDLCALC 170 (H) 07/13/2020    Objective:  BP 104/68   Pulse 84   Temp 98.3 F (36.8 C) (Oral)   Ht 5\' 1"  (1.549 m)   Wt 111 lb 4.8 oz (50.5 kg)   LMP  (LMP Unknown)   SpO2 98%   BMI 21.03 kg/m   Weight: 111 lb 4.8 oz (50.5 kg)   BP Readings from Last 3 Encounters:  07/13/20 104/68  05/10/19 121/63  05/04/19 (!) 96/56   Wt Readings from Last 3 Encounters:  07/13/20 111 lb 4.8 oz (50.5 kg)  05/10/19 111 lb (50.3 kg)  05/04/19 111 lb (50.3 kg)    Physical Exam Exam conducted with a chaperone present.  Constitutional:      General: She is not in acute distress.    Appearance: She is well-developed.  HENT:     Head: Normocephalic and atraumatic.     Right Ear: External ear normal.     Left Ear: External ear normal.     Mouth/Throat:     Pharynx: No oropharyngeal exudate.  Eyes:     Conjunctiva/sclera: Conjunctivae normal.     Pupils: Pupils are equal, round, and reactive to light.  Neck:     Thyroid: No thyromegaly.  Cardiovascular:     Rate and Rhythm: Normal rate and regular rhythm.     Heart sounds: Normal heart sounds. No murmur heard.  No friction rub. No gallop.   Pulmonary:     Effort: Pulmonary effort is normal.     Breath sounds: Normal breath sounds.  Abdominal:     General: Bowel sounds are normal. There is no distension.      Palpations: Abdomen is soft. There is no mass.     Tenderness: There is no abdominal tenderness. There is no guarding.     Hernia: No hernia is present.  Genitourinary:    Exam position: Supine.     Labia:        Right: No rash or tenderness.        Left: No rash or tenderness.      Vagina: Normal.     Uterus: Normal.      Adnexa: Right adnexa normal and left adnexa normal.  Comments: There is a small polyp at cervical os Musculoskeletal:        General: No tenderness or deformity. Normal range of motion.     Cervical back: Normal range of motion and neck supple.  Lymphadenopathy:     Cervical: No cervical adenopathy.  Skin:    General: Skin is warm and dry.     Findings: No rash.  Neurological:     Mental Status: She is alert and oriented to person, place, and time.     Deep Tendon Reflexes: Reflexes normal.     Reflex Scores:      Tricep reflexes are 2+ on the right side and 2+ on the left side.      Bicep reflexes are 2+ on the right side and 2+ on the left side.      Brachioradialis reflexes are 2+ on the right side and 2+ on the left side.      Patellar reflexes are 2+ on the right side and 2+ on the left side. Psychiatric:        Speech: Speech normal.        Behavior: Behavior normal.        Thought Content: Thought content normal.     Assessment/Plan: Health Maintenance Due  Topic Date Due  . Hepatitis C Screening  Never done  . HIV Screening  Never done  . PAP SMEAR-Modifier  09/08/2016  . INFLUENZA VACCINE  Never done   Health Maintenance reviewed.  1. Preventative health care She is up to date with preventative health care. - Hepatitis C antibody; Future - HIV Antibody (routine testing w rflx); Future - Tdap vaccine greater than or equal to 7yo IM - Cytology - PAP - HIV Antibody (routine testing w rflx) - Hepatitis C antibody  2. Hyperlipidemia, unspecified hyperlipidemia type Has been diet controlled; we will recheck bloodwork today. -  Comprehensive metabolic panel; Future - Lipid panel; Future - TSH; Future - TSH - Lipid panel - Comprehensive metabolic panel  3. History of anemia - CBC with Differential/Platelet; Future - CBC with Differential/Platelet  Return in about 1 year (around 07/13/2021) for physical exam.  Micheline Rough, MD

## 2020-07-16 LAB — COMPREHENSIVE METABOLIC PANEL
AG Ratio: 1.9 (calc) (ref 1.0–2.5)
ALT: 21 U/L (ref 6–29)
AST: 27 U/L (ref 10–35)
Albumin: 5 g/dL (ref 3.6–5.1)
Alkaline phosphatase (APISO): 75 U/L (ref 37–153)
BUN: 14 mg/dL (ref 7–25)
CO2: 18 mmol/L — ABNORMAL LOW (ref 20–32)
Calcium: 9.6 mg/dL (ref 8.6–10.4)
Chloride: 102 mmol/L (ref 98–110)
Creat: 0.93 mg/dL (ref 0.50–1.05)
Globulin: 2.6 g/dL (calc) (ref 1.9–3.7)
Glucose, Bld: 66 mg/dL (ref 65–99)
Potassium: 4.9 mmol/L (ref 3.5–5.3)
Sodium: 137 mmol/L (ref 135–146)
Total Bilirubin: 0.4 mg/dL (ref 0.2–1.2)
Total Protein: 7.6 g/dL (ref 6.1–8.1)

## 2020-07-16 LAB — CBC WITH DIFFERENTIAL/PLATELET
Absolute Monocytes: 282 cells/uL (ref 200–950)
Basophils Absolute: 28 cells/uL (ref 0–200)
Basophils Relative: 0.6 %
Eosinophils Absolute: 89 cells/uL (ref 15–500)
Eosinophils Relative: 1.9 %
HCT: 44.5 % (ref 35.0–45.0)
Hemoglobin: 15 g/dL (ref 11.7–15.5)
Lymphs Abs: 949 cells/uL (ref 850–3900)
MCH: 30.8 pg (ref 27.0–33.0)
MCHC: 33.7 g/dL (ref 32.0–36.0)
MCV: 91.4 fL (ref 80.0–100.0)
MPV: 11.3 fL (ref 7.5–12.5)
Monocytes Relative: 6 %
Neutro Abs: 3351 cells/uL (ref 1500–7800)
Neutrophils Relative %: 71.3 %
Platelets: 230 10*3/uL (ref 140–400)
RBC: 4.87 10*6/uL (ref 3.80–5.10)
RDW: 12.3 % (ref 11.0–15.0)
Total Lymphocyte: 20.2 %
WBC: 4.7 10*3/uL (ref 3.8–10.8)

## 2020-07-16 LAB — LIPID PANEL
Cholesterol: 287 mg/dL — ABNORMAL HIGH (ref <200)
HDL: 79 mg/dL (ref >=50)
LDL Cholesterol (Calc): 170 mg/dL (calc) — ABNORMAL HIGH
Non-HDL Cholesterol (Calc): 208 mg/dL (calc) — ABNORMAL HIGH (ref <130)
Total CHOL/HDL Ratio: 3.6 (calc) (ref <5.0)
Triglycerides: 215 mg/dL — ABNORMAL HIGH (ref <150)

## 2020-07-16 LAB — CYTOLOGY - PAP
Comment: NEGATIVE
Diagnosis: NEGATIVE
High risk HPV: NEGATIVE

## 2020-07-16 LAB — HEPATITIS C ANTIBODY
Hepatitis C Ab: NONREACTIVE
SIGNAL TO CUT-OFF: 0.01 (ref <1.00)

## 2020-07-16 LAB — TSH: TSH: 3.64 mIU/L

## 2020-07-16 LAB — HIV ANTIBODY (ROUTINE TESTING W REFLEX): HIV 1&2 Ab, 4th Generation: NONREACTIVE

## 2020-10-15 IMAGING — MG DIGITAL DIAGNOSTIC BILATERAL MAMMOGRAM WITH TOMO AND CAD
6 of 10 series · 6 of 30 positions shown · non-contrast
Comparison: Previous exam(s).

CLINICAL DATA: 52-year-old female with intermittent, focal left
breast pain for 2-3 months.

EXAM:
DIGITAL DIAGNOSTIC BILATERAL MAMMOGRAM WITH CAD AND TOMO
ULTRASOUND LEFT BREAST

[L CC synth-2D]
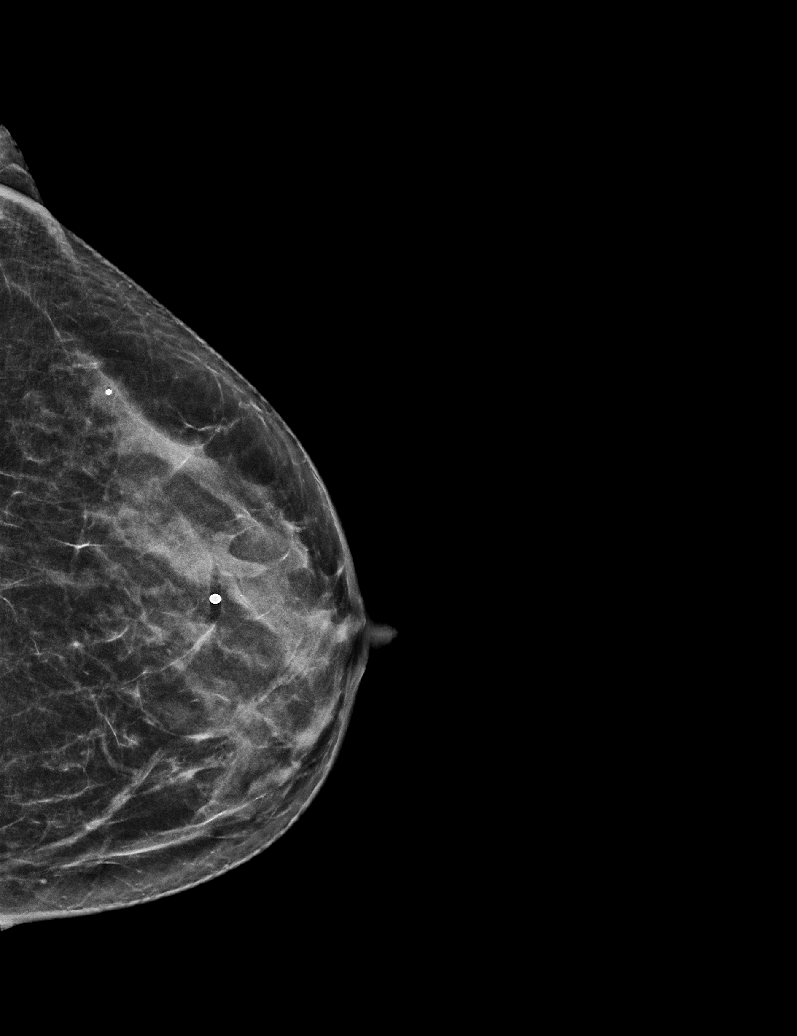

[R MLO synth-2D]
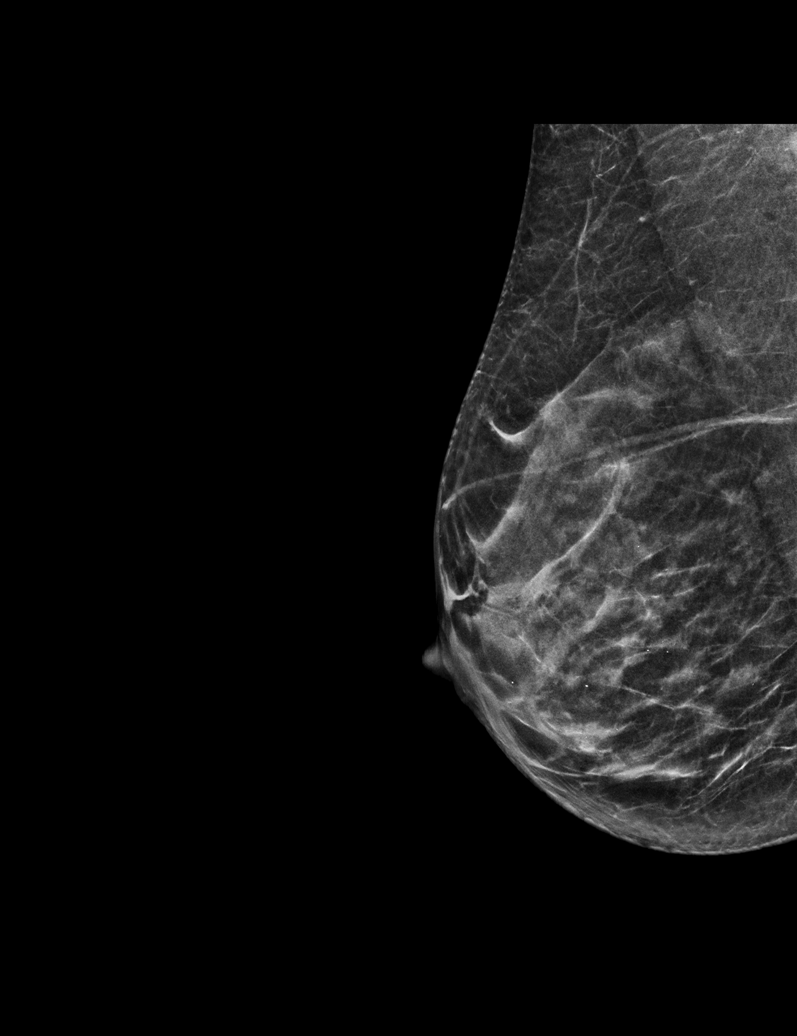

[R CC synth-2D]
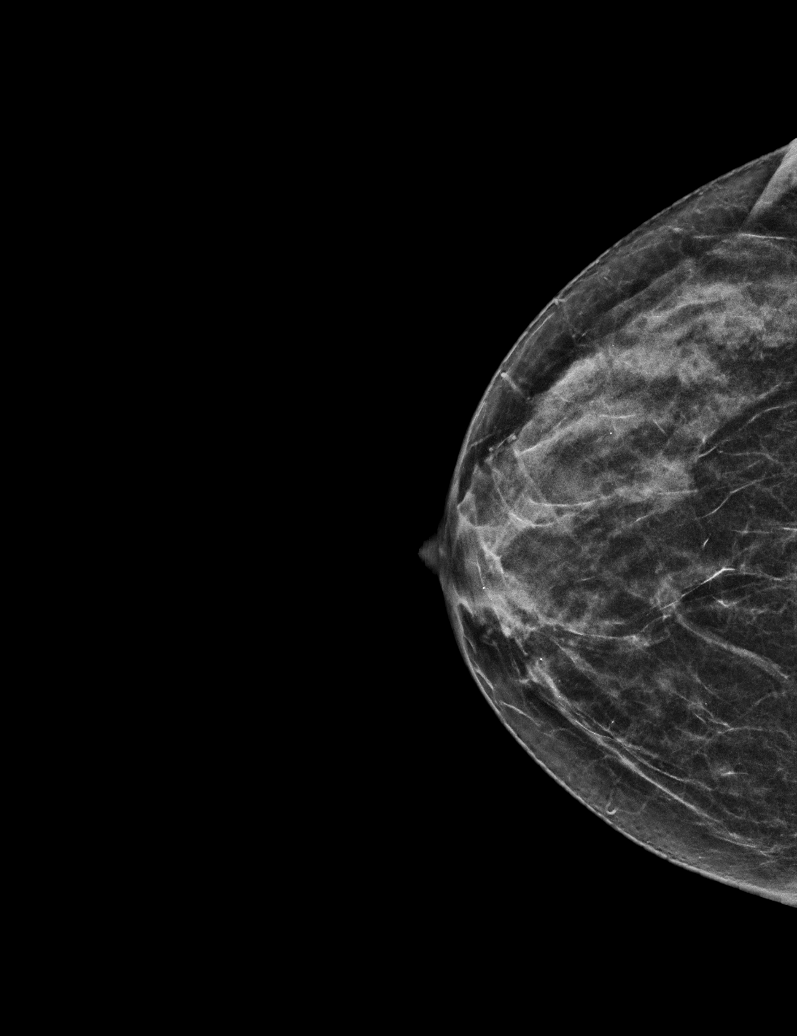

[L TAN synth-2D]
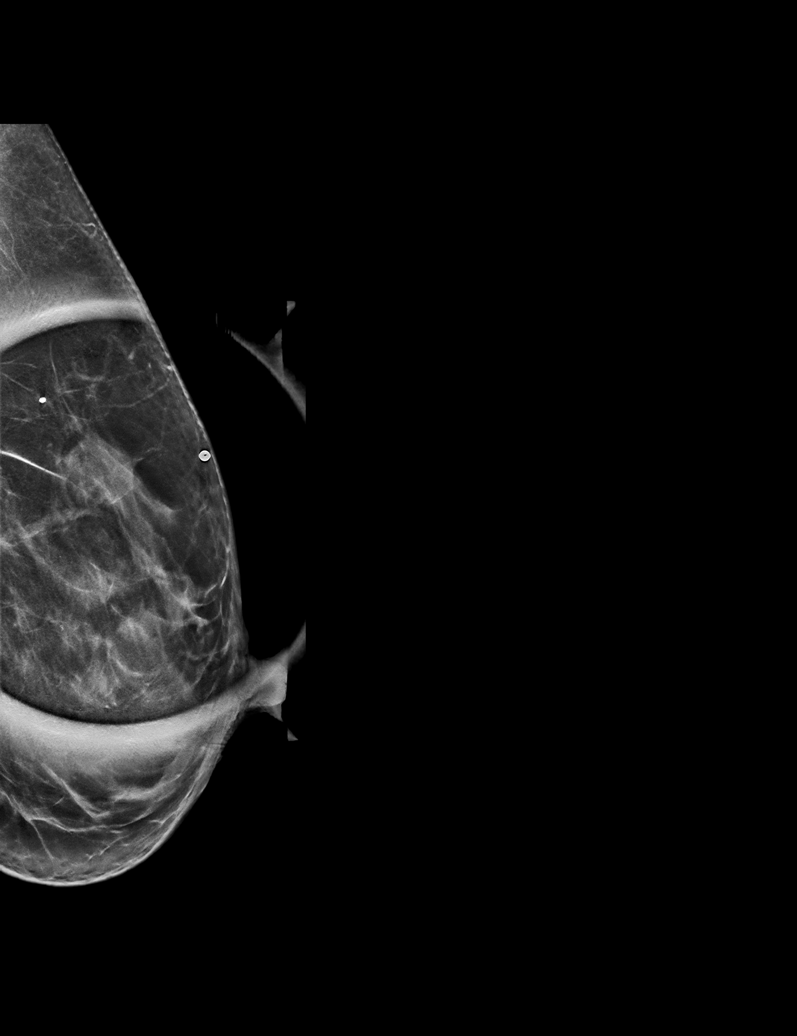

[L MLO synth-2D]
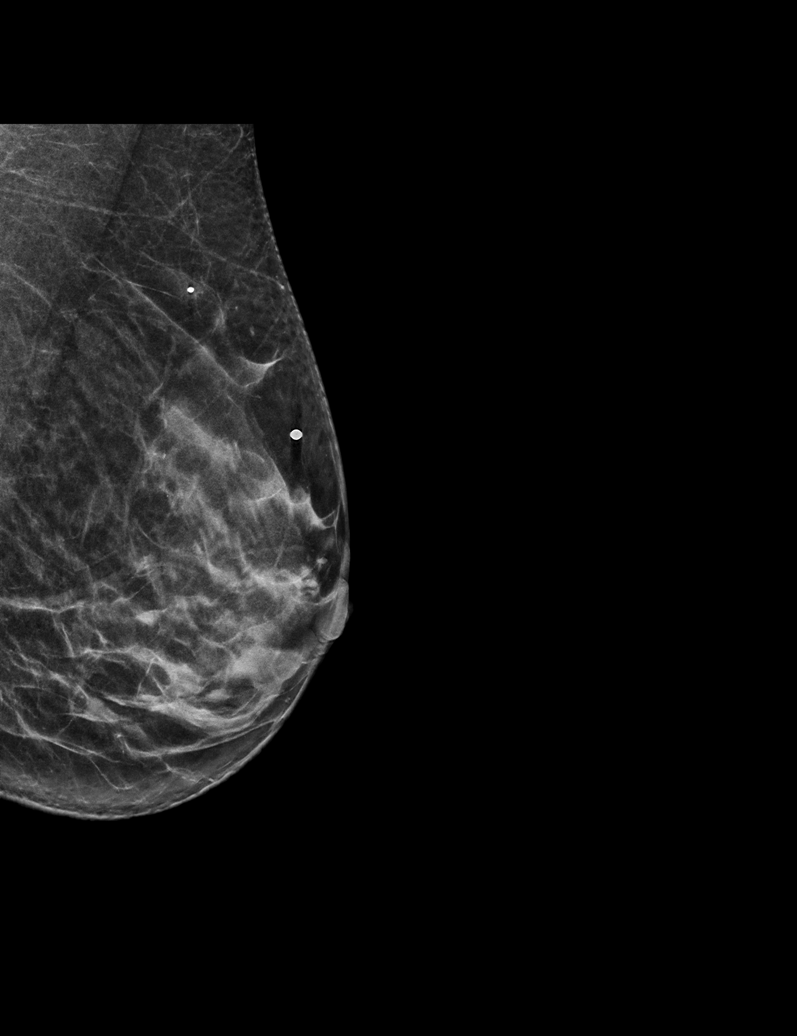

[L CC tomo · tomo slice 24/47.0]
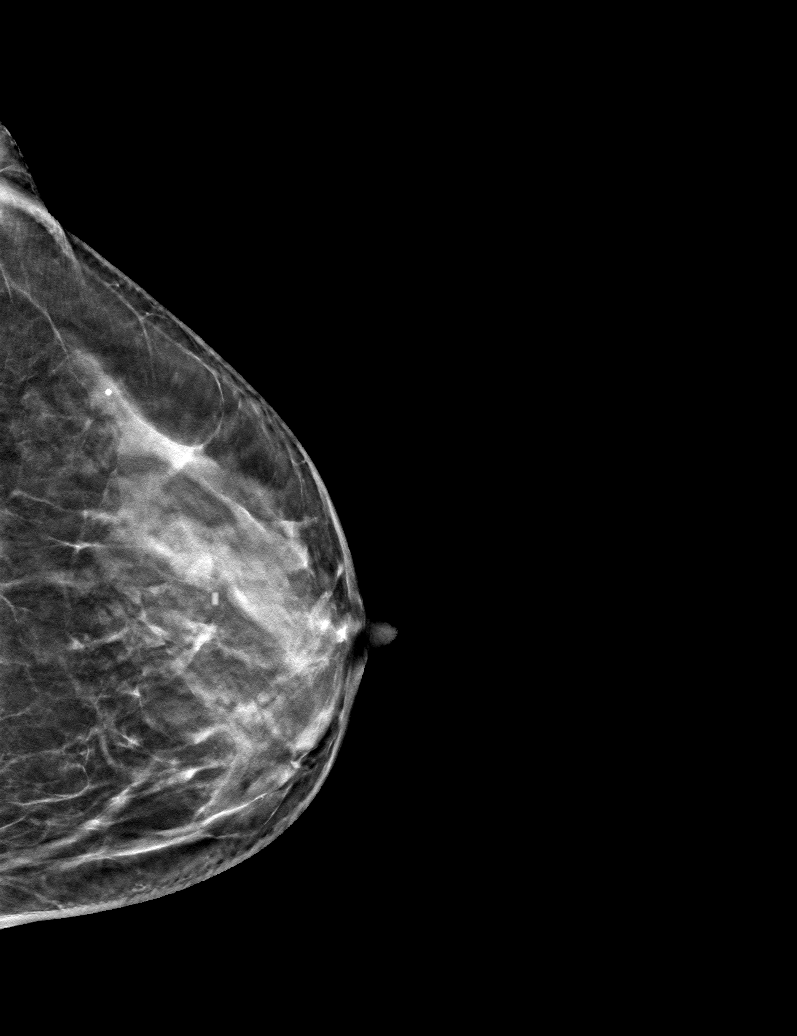

[6 of 30 positions shown; findings below may reference images not displayed]

ACR Breast Density Category c: The breast tissue is heterogeneously
dense, which may obscure small masses.
FINDINGS: Radiopaque BB was placed at the site of the patient's focal pain in
the superior central left breast at middle depth. No suspicious
mammographic findings are seen deep to the radiopaque BB or within
the remainder of either breast. The parenchymal pattern is stable.

Mammographic images were processed with CAD.

Targeted ultrasound is performed, showing normal fibroglandular
tissue without focal or suspicious sonographic abnormality.
Evaluation of the superior and subareolar left breast was performed.
IMPRESSION: 1. No mammographic evidence of malignancy in either breast.
2. No suspicious mammographic or sonographic findings corresponding
to the patient's focal left breast pain.

RECOMMENDATION:
1. Clinical follow-up recommended for the painful area of concern in
the left breast. Any further workup should be based on clinical
grounds.
2.  Screening mammogram in one year.(Code:RF-F-1IN)

I have discussed the findings and recommendations with the patient.
Results were also provided in writing at the conclusion of the
visit. If applicable, a reminder letter will be sent to the patient
regarding the next appointment.

BI-RADS CATEGORY  1: Negative.

## 2021-01-17 ENCOUNTER — Other Ambulatory Visit: Payer: Self-pay

## 2021-01-18 ENCOUNTER — Encounter: Payer: Self-pay | Admitting: Family Medicine

## 2021-01-18 ENCOUNTER — Ambulatory Visit: Payer: 59 | Admitting: Family Medicine

## 2021-01-18 VITALS — BP 100/70 | HR 64 | Temp 97.9°F | Ht 61.0 in | Wt 114.3 lb

## 2021-01-18 DIAGNOSIS — Z23 Encounter for immunization: Secondary | ICD-10-CM | POA: Diagnosis not present

## 2021-01-18 DIAGNOSIS — E785 Hyperlipidemia, unspecified: Secondary | ICD-10-CM | POA: Diagnosis not present

## 2021-01-18 LAB — COMPREHENSIVE METABOLIC PANEL
ALT: 17 U/L (ref 0–35)
AST: 21 U/L (ref 0–37)
Albumin: 4.5 g/dL (ref 3.5–5.2)
Alkaline Phosphatase: 58 U/L (ref 39–117)
BUN: 18 mg/dL (ref 6–23)
CO2: 24 mEq/L (ref 19–32)
Calcium: 9 mg/dL (ref 8.4–10.5)
Chloride: 103 mEq/L (ref 96–112)
Creatinine, Ser: 0.76 mg/dL (ref 0.40–1.20)
GFR: 88.96 mL/min (ref >60.00)
Glucose, Bld: 70 mg/dL (ref 70–99)
Potassium: 3.7 mEq/L (ref 3.5–5.1)
Sodium: 138 mEq/L (ref 135–145)
Total Bilirubin: 0.5 mg/dL (ref 0.2–1.2)
Total Protein: 7.1 g/dL (ref 6.0–8.3)

## 2021-01-18 LAB — LIPID PANEL
Cholesterol: 249 mg/dL — ABNORMAL HIGH (ref 0–200)
HDL: 81.6 mg/dL (ref >39.00)
LDL Cholesterol: 158 mg/dL — ABNORMAL HIGH (ref 0–99)
NonHDL: 167.02
Total CHOL/HDL Ratio: 3
Triglycerides: 46 mg/dL (ref 0.0–149.0)
VLDL: 9.2 mg/dL (ref 0.0–40.0)

## 2021-01-18 NOTE — Addendum Note (Signed)
Addended by: Agnes Lawrence on: 01/18/2021 10:11 AM   Modules accepted: Orders

## 2021-01-18 NOTE — Addendum Note (Signed)
Addended by: Trenda Moots on: 2/99/2426 10:06 AM   Modules accepted: Orders

## 2021-01-18 NOTE — Progress Notes (Signed)
  Makayla King DOB: [birth year] Encounter date: 01/18/2021  This is a 55 y.o. female who presents with Chief Complaint  Patient presents with  . Follow-up    History of present illness: Last visit was 07/2020.  Hyperlipidemia: LDL was elevated to 170 on last check (07/2020).  She wanted to work on dietary changes and recheck in follow-up. Has been eating more vegetables. Has always limited sugar; but been even more strict. Would prefer not to take a statin.   History of melanoma: Following up for every 3 month skin checks with dermatology.  No Known Allergies Current Meds  Medication Sig  . Multiple Vitamins-Minerals (MULTI VITAMIN/MINERALS) TABS Take 1 tablet by mouth daily. One Source  . Omega-3 Fatty Acids (FISH OIL OMEGA-3 PO) Take 1,200 mg by mouth daily.     Review of Systems  Constitutional: Negative for chills, fatigue and fever.  Respiratory: Negative for cough, chest tightness, shortness of breath and wheezing.   Cardiovascular: Negative for chest pain, palpitations and leg swelling.    Objective:  BP 100/70 (BP Location: Left Arm, Patient Position: Sitting, Cuff Size: Normal)   Pulse 64   Temp 97.9 F (36.6 C) (Oral)   Ht 5\' 1"  (1.549 m)   Wt 114 lb 4.8 oz (51.8 kg)   LMP  (LMP Unknown)   SpO2 98%   BMI 21.60 kg/m   Weight: 114 lb 4.8 oz (51.8 kg)   BP Readings from Last 3 Encounters:  01/18/21 100/70  07/13/20 104/68  05/10/19 121/63   Wt Readings from Last 3 Encounters:  01/18/21 114 lb 4.8 oz (51.8 kg)  07/13/20 111 lb 4.8 oz (50.5 kg)  05/10/19 111 lb (50.3 kg)    Physical Exam Constitutional:      General: She is not in acute distress.    Appearance: She is well-developed.  Cardiovascular:     Rate and Rhythm: Normal rate and regular rhythm.     Heart sounds: Normal heart sounds. No murmur heard. No friction rub.  Pulmonary:     Effort: Pulmonary effort is normal. No respiratory distress.     Breath sounds: Normal breath sounds. No  wheezing or rales.  Musculoskeletal:     Right lower leg: No edema.     Left lower leg: No edema.  Neurological:     Mental Status: She is alert and oriented to person, place, and time.  Psychiatric:        Behavior: Behavior normal.     Assessment/Plan  1. Hyperlipidemia, unspecified hyperlipidemia type Diet controlled. She does not want to be on medication if not necessary; we will continue to monitor.  - Comprehensive metabolic panel; Future - Lipid panel; Future  2. Need for shingles vaccine Will complete today.    Return in about 1 year (around 01/18/2022) for physical exam.    Micheline Rough, MD

## 2021-05-10 ENCOUNTER — Ambulatory Visit: Payer: 59

## 2021-05-15 ENCOUNTER — Ambulatory Visit (INDEPENDENT_AMBULATORY_CARE_PROVIDER_SITE_OTHER): Payer: 59

## 2021-05-15 ENCOUNTER — Other Ambulatory Visit: Payer: Self-pay

## 2021-05-15 DIAGNOSIS — Z23 Encounter for immunization: Secondary | ICD-10-CM | POA: Diagnosis not present

## 2021-05-15 NOTE — Progress Notes (Signed)
Per orders of Dr. Ethlyn Gallery, injection of 2nd Shingrix vaccine given by Franco Collet. Patient tolerated injection well.

## 2021-07-19 ENCOUNTER — Encounter: Payer: 59 | Admitting: Family Medicine

## 2021-12-16 ENCOUNTER — Other Ambulatory Visit: Payer: Self-pay | Admitting: Family Medicine

## 2021-12-16 DIAGNOSIS — Z1231 Encounter for screening mammogram for malignant neoplasm of breast: Secondary | ICD-10-CM

## 2021-12-25 ENCOUNTER — Ambulatory Visit
Admission: RE | Admit: 2021-12-25 | Discharge: 2021-12-25 | Disposition: A | Discharge status: Home / Self Care | Payer: No Typology Code available for payment source | Source: Ambulatory Visit | Attending: Family Medicine | Admitting: Family Medicine

## 2021-12-25 DIAGNOSIS — Z1231 Encounter for screening mammogram for malignant neoplasm of breast: Secondary | ICD-10-CM

## 2022-01-22 ENCOUNTER — Encounter: Payer: Self-pay | Admitting: Family Medicine

## 2022-01-22 ENCOUNTER — Ambulatory Visit (INDEPENDENT_AMBULATORY_CARE_PROVIDER_SITE_OTHER): Payer: No Typology Code available for payment source | Admitting: Family Medicine

## 2022-01-22 VITALS — BP 102/80 | HR 60 | Temp 97.7°F | Ht 60.75 in | Wt 114.6 lb

## 2022-01-22 DIAGNOSIS — Z131 Encounter for screening for diabetes mellitus: Secondary | ICD-10-CM

## 2022-01-22 DIAGNOSIS — E785 Hyperlipidemia, unspecified: Secondary | ICD-10-CM | POA: Diagnosis not present

## 2022-01-22 DIAGNOSIS — Z0000 Encounter for general adult medical examination without abnormal findings: Secondary | ICD-10-CM

## 2022-01-22 LAB — CBC WITH DIFFERENTIAL/PLATELET
Basophils Absolute: 0 10*3/uL (ref 0.0–0.1)
Basophils Relative: 0.7 % (ref 0.0–3.0)
Eosinophils Absolute: 0.1 10*3/uL (ref 0.0–0.7)
Eosinophils Relative: 3 % (ref 0.0–5.0)
HCT: 39.5 % (ref 36.0–46.0)
Hemoglobin: 13.3 g/dL (ref 12.0–15.0)
Lymphocytes Relative: 31 % (ref 12.0–46.0)
Lymphs Abs: 1.5 10*3/uL (ref 0.7–4.0)
MCHC: 33.8 g/dL (ref 30.0–36.0)
MCV: 89.9 fl (ref 78.0–100.0)
Monocytes Absolute: 0.3 10*3/uL (ref 0.1–1.0)
Monocytes Relative: 5.7 % (ref 3.0–12.0)
Neutro Abs: 2.8 10*3/uL (ref 1.4–7.7)
Neutrophils Relative %: 59.6 % (ref 43.0–77.0)
Platelets: 197 10*3/uL (ref 150.0–400.0)
RBC: 4.39 Mil/uL (ref 3.87–5.11)
RDW: 14 % (ref 11.5–15.5)
WBC: 4.8 10*3/uL (ref 4.0–10.5)

## 2022-01-22 LAB — LIPID PANEL
Cholesterol: 234 mg/dL — ABNORMAL HIGH (ref 0–200)
HDL: 91 mg/dL (ref >39.00)
LDL Cholesterol: 130 mg/dL — ABNORMAL HIGH (ref 0–99)
NonHDL: 143.01
Total CHOL/HDL Ratio: 3
Triglycerides: 66 mg/dL (ref 0.0–149.0)
VLDL: 13.2 mg/dL (ref 0.0–40.0)

## 2022-01-22 LAB — COMPREHENSIVE METABOLIC PANEL
ALT: 23 U/L (ref 0–35)
AST: 25 U/L (ref 0–37)
Albumin: 4.6 g/dL (ref 3.5–5.2)
Alkaline Phosphatase: 65 U/L (ref 39–117)
BUN: 18 mg/dL (ref 6–23)
CO2: 25 mEq/L (ref 19–32)
Calcium: 9.5 mg/dL (ref 8.4–10.5)
Chloride: 103 mEq/L (ref 96–112)
Creatinine, Ser: 0.89 mg/dL (ref 0.40–1.20)
GFR: 73.09 mL/min (ref >60.00)
Glucose, Bld: 78 mg/dL (ref 70–99)
Potassium: 4.5 mEq/L (ref 3.5–5.1)
Sodium: 138 mEq/L (ref 135–145)
Total Bilirubin: 0.7 mg/dL (ref 0.2–1.2)
Total Protein: 7.3 g/dL (ref 6.0–8.3)

## 2022-01-22 NOTE — Progress Notes (Signed)
Makayla King DOB: September 21, 1965 Encounter date: 01/22/2022  This is a 56 y.o. female who presents for complete physical   History of present illness/Additional concerns: Last visit was one year ago.  Still playing tennis regularly. Intermittent fasting with low carb diet. Energy level is good.   Follows with derm q 3 mo due to melanoma history.  Last lipids were elevated. She has preferred not to start statin in past. HDL high at 81.6.  The 10-year ASCVD risk score (Arnett DK, et al., 2019) is: 0.9%   Values used to calculate the score:     Age: 73 years     Sex: Female     Is Non-Hispanic African American: No     Diabetic: No     Tobacco smoker: No     Systolic Blood Pressure: 366 mmHg     Is BP treated: No     HDL Cholesterol: 91 mg/dL     Total Cholesterol: 234 mg/dL   Mammogram 12/25/21 normal. Colonoscopy repeat due 10/2028. Pap done 07/2020 normal and negative HPV  Seeing dentist regularly.   Past Medical History:  Diagnosis Date   Anemia    Arthritis    left knee   Cancer ([place])    melanoma right lower leg   Psoriasis    SVD (spontaneous vaginal delivery)    x 2   Past Surgical History:  Procedure Laterality Date   APPENDECTOMY     2001   COLONOSCOPY  10/26/2018   DILITATION & CURRETTAGE/HYSTROSCOPY WITH NOVASURE ABLATION N/A 06/15/2013   Procedure: DILATATION & CURETTAGE/HYSTEROSCOPY WITH NOVASURE ABLATION;  Surgeon: Melina Schools, MD;  Location: [facility];  Service: Gynecology;  Laterality: N/A;   MELANOMA EXCISION WITH SENTINEL LYMPH NODE BIOPSY Right 05/10/2019   Procedure: WIDE LOCAL EXCISION, ADVANCEMENT FLAP CLOSURE, RIGHT LOWER LEG MELANOMA EXCISION WITH SENTINEL LYMPH NODE BIOPSY;  Surgeon: [name], MD;  Location: [facility];  Service: General;  Laterality: Right;   SKIN BIOPSY Left 05/10/2019   Procedure: Biopsy Skin;  Surgeon: [name], MD;  Location: [facility];  Service: General;  Laterality: Left;   No Known Allergies Current Meds  Medication  Sig   Multiple Vitamins-Minerals (MULTI VITAMIN/MINERALS) TABS Take 1 tablet by mouth daily. One Source   Omega-3 Fatty Acids (FISH OIL OMEGA-3 PO) Take 1,200 mg by mouth daily.    Social History   Tobacco Use   Smoking status: Never   Smokeless tobacco: Never  Substance Use Topics   Alcohol use: Yes    Alcohol/week: 4.0 standard drinks    Types: 4 Standard drinks or equivalent per week   Family History  Problem Relation Age of Onset   High blood pressure Mother    High blood pressure Father    Colon cancer Father        uncertain details   High blood pressure Sister    Breast cancer Maternal Grandmother 80   Diabetes Maternal Grandfather    Heart disease Maternal Grandfather    CAD Maternal Grandfather    Multiple sclerosis Paternal Grandmother    Emphysema Paternal Grandfather        smoker   Esophageal cancer Neg Hx    Rectal cancer Neg Hx    Stomach cancer Neg Hx      Review of Systems  Constitutional:  Negative for activity change, appetite change, chills, fatigue, fever and unexpected weight change.  HENT:  Negative for congestion, ear pain, hearing loss, sinus pressure, sinus pain, sore throat and  trouble swallowing.   Eyes:  Negative for pain and visual disturbance.  Respiratory:  Negative for cough, chest tightness, shortness of breath and wheezing.   Cardiovascular:  Negative for chest pain, palpitations and leg swelling.  Gastrointestinal:  Negative for abdominal pain, blood in stool, constipation, diarrhea, nausea and vomiting.  Genitourinary:  Negative for difficulty urinating and menstrual problem.  Musculoskeletal:  Negative for arthralgias and back pain.  Skin:  Negative for rash.  Neurological:  Negative for dizziness, weakness, numbness and headaches.  Hematological:  Negative for adenopathy. Does not bruise/bleed easily.  Psychiatric/Behavioral:  Negative for sleep disturbance and suicidal ideas. The patient is not nervous/anxious.    CBC:  Lab  Results  Component Value Date   WBC 4.8 01/22/2022   HGB 13.3 01/22/2022   HCT 39.5 01/22/2022   MCH 30.8 07/13/2020   MCHC 33.8 01/22/2022   RDW 14.0 01/22/2022   PLT 197.0 01/22/2022   MPV 11.3 07/13/2020   CMP: Lab Results  Component Value Date   NA 138 01/22/2022   K 4.5 01/22/2022   CL 103 01/22/2022   CO2 25 01/22/2022   ANIONGAP 9 05/04/2019   GLUCOSE 78 01/22/2022   BUN 18 01/22/2022   CREATININE 0.89 01/22/2022   CREATININE 0.93 07/13/2020   GFRAA >60 05/04/2019   CALCIUM 9.5 01/22/2022   PROT 7.3 01/22/2022   BILITOT 0.7 01/22/2022   ALKPHOS 65 01/22/2022   ALT 23 01/22/2022   AST 25 01/22/2022   LIPID: Lab Results  Component Value Date   CHOL 234 (H) 01/22/2022   TRIG 66.0 01/22/2022   HDL 91.00 01/22/2022   LDLCALC 130 (H) 01/22/2022   LDLCALC 170 (H) 07/13/2020    Objective:  BP 102/80 (BP Location: Left Arm, Patient Position: Sitting, Cuff Size: Normal)   Pulse 60   Temp 97.7 F (36.5 C) (Oral)   Ht 5' 0.75" (1.543 m)   Wt 114 lb 9.6 oz (52 kg)   LMP  (LMP Unknown)   SpO2 100%   BMI 21.83 kg/m   Weight: 114 lb 9.6 oz (52 kg)   BP Readings from Last 3 Encounters:  01/22/22 102/80  01/18/21 100/70  07/13/20 104/68   Wt Readings from Last 3 Encounters:  01/22/22 114 lb 9.6 oz (52 kg)  01/18/21 114 lb 4.8 oz (51.8 kg)  07/13/20 111 lb 4.8 oz (50.5 kg)    Physical Exam Constitutional:      General: She is not in acute distress.    Appearance: She is well-developed.  HENT:     Head: Normocephalic and atraumatic.     Right Ear: External ear normal.     Left Ear: External ear normal.     Mouth/Throat:     Pharynx: No oropharyngeal exudate.  Eyes:     Conjunctiva/sclera: Conjunctivae normal.     Pupils: Pupils are equal, round, and reactive to light.  Neck:     Thyroid: No thyromegaly.  Cardiovascular:     Rate and Rhythm: Normal rate and regular rhythm.     Heart sounds: Normal heart sounds. No murmur heard.   No friction rub.  No gallop.  Pulmonary:     Effort: Pulmonary effort is normal.     Breath sounds: Normal breath sounds.  Abdominal:     General: Bowel sounds are normal. There is no distension.     Palpations: Abdomen is soft. There is no mass.     Tenderness: There is no abdominal tenderness. There is no guarding.  Hernia: No hernia is present.  Musculoskeletal:        General: No tenderness or deformity. Normal range of motion.     Cervical back: Normal range of motion and neck supple.  Lymphadenopathy:     Cervical: No cervical adenopathy.  Skin:    General: Skin is warm and dry.     Findings: No rash.  Neurological:     Mental Status: She is alert and oriented to person, place, and time.     Deep Tendon Reflexes: Reflexes normal.     Reflex Scores:      Tricep reflexes are 2+ on the right side and 2+ on the left side.      Bicep reflexes are 2+ on the right side and 2+ on the left side.      Brachioradialis reflexes are 2+ on the right side and 2+ on the left side.      Patellar reflexes are 2+ on the right side and 2+ on the left side. Psychiatric:        Speech: Speech normal.        Behavior: Behavior normal.        Thought Content: Thought content normal.    Assessment/Plan: There are no preventive care reminders to display for this patient.  Health Maintenance reviewed.   1. Preventative health care Keep up with regular exercise, healthy diet. - CBC with Differential/Platelet; Future  2. Hyperlipidemia, unspecified hyperlipidemia type We discussed consideration for NMR liporprofile or coronary calcium Ct in future if on the fence of needing cholesterol treatment. - Lipid panel; Future  3. Screening for diabetes mellitus - Comprehensive metabolic panel; Future   Return in about 1 year (around 01/23/2023) for physical exam.  Micheline Rough, MD

## 2022-06-16 ENCOUNTER — Encounter: Payer: Self-pay | Admitting: Family Medicine

## 2022-06-16 ENCOUNTER — Ambulatory Visit (INDEPENDENT_AMBULATORY_CARE_PROVIDER_SITE_OTHER): Payer: No Typology Code available for payment source | Admitting: Family Medicine

## 2022-06-16 DIAGNOSIS — E785 Hyperlipidemia, unspecified: Secondary | ICD-10-CM

## 2022-06-16 DIAGNOSIS — C439 Malignant melanoma of skin, unspecified: Secondary | ICD-10-CM | POA: Insufficient documentation

## 2022-06-16 NOTE — Progress Notes (Signed)
Established Patient Office Visit  Subjective   Patient ID: Makayla King, female    DOB: April 09, 1966  Age: 56 y.o. MRN: [id number]  Chief Complaint  Patient presents with   Establish Care    Patient is here transition of care visit. Patient reports she has a history of melanoma, sees [facility] for this, had a larger melanoma removed from the right leg, has a spot on her left shoulder which is being removed on November 6th. Sees them every 6 months for skin checks. We reviewed her HM, she is UTD on all measures currently. Reviewed her last lipid panel also.   Current Outpatient Medications  Medication Instructions   Multiple Vitamins-Minerals (MULTI VITAMIN/MINERALS) TABS 1 tablet, Oral, Daily, One Source    Omega-3 Fatty Acids (FISH OIL OMEGA-3 PO) 1,200 mg, Oral, Daily    Patient Active Problem List   Diagnosis Date Noted   Melanoma of skin ([place]) 06/16/2022   Hyperlipidemia 07/13/2020   Left knee pain 09/13/2018   Psoriasis of scalp 09/13/2018      Review of Systems  All other systems reviewed and are negative.     Objective:     BP 100/70 (BP Location: Left Arm, Patient Position: Sitting, Cuff Size: Normal)   Pulse 62   Temp 97.7 F (36.5 C) (Oral)   Ht 5' 0.75" (1.543 m)   Wt 112 lb 11.2 oz (51.1 kg)   LMP  (LMP Unknown)   SpO2 99%   BMI 21.47 kg/m    Physical Exam Vitals reviewed.  Constitutional:      Appearance: Normal appearance. She is well-groomed and normal weight.  Eyes:     Conjunctiva/sclera: Conjunctivae normal.  Neck:     Thyroid: No thyromegaly.  Cardiovascular:     Rate and Rhythm: Normal rate and regular rhythm.     Pulses: Normal pulses.     Heart sounds: S1 normal and S2 normal.  Pulmonary:     Effort: Pulmonary effort is normal.     Breath sounds: Normal breath sounds and air entry.  Abdominal:     General: Bowel sounds are normal.  Musculoskeletal:     Right lower leg: No edema.     Left lower leg: No edema.   Neurological:     Mental Status: She is alert and oriented to person, place, and time. Mental status is at baseline.     Gait: Gait is intact.  Psychiatric:        Mood and Affect: Mood and affect normal.        Speech: Speech normal.        Behavior: Behavior normal.        Judgment: Judgment normal.      No results found for any visits on 06/16/22.  Last metabolic panel Lab Results  Component Value Date   GLUCOSE 78 01/22/2022   NA 138 01/22/2022   K 4.5 01/22/2022   CL 103 01/22/2022   CO2 25 01/22/2022   BUN 18 01/22/2022   CREATININE 0.89 01/22/2022   GFRNONAA >60 05/04/2019   CALCIUM 9.5 01/22/2022   PROT 7.3 01/22/2022   ALBUMIN 4.6 01/22/2022   BILITOT 0.7 01/22/2022   ALKPHOS 65 01/22/2022   AST 25 01/22/2022   ALT 23 01/22/2022   ANIONGAP 9 05/04/2019   Last lipids Lab Results  Component Value Date   CHOL 234 (H) 01/22/2022   HDL 91.00 01/22/2022   LDLCALC 130 (H) 01/22/2022   TRIG 66.0 01/22/2022  CHOLHDL 3 01/22/2022      The 10-year ASCVD risk score (Arnett DK, et al., 2019) is: 0.9%    Assessment & Plan:   Problem List Items Addressed This Visit       Musculoskeletal and Integument   Melanoma of skin ([place]) (Chronic)    Being followed by [facility]. She is going to have another spot removed on her left shoulder November 6th. I advised she continue skin checks regularly. Will continue to follow as needed.        Other   Hyperlipidemia    We reivewed her last lipid panel, her HDL/TG are very good, LDL is slightly high at 130 but she dies not need any medication as this time. Her 10 year CVD score is <1%.       Return in about 32 weeks (around 01/26/2023) for Annual Physical Exam .    Farrel Conners, MD

## 2022-06-16 NOTE — Assessment & Plan Note (Signed)
We reivewed her last lipid panel, her HDL/TG are very good, LDL is slightly high at 130 but she dies not need any medication as this time. Her 10 year CVD score is <1%.

## 2022-06-16 NOTE — Assessment & Plan Note (Addendum)
Being followed by [facility]. She is going to have another spot removed on her left shoulder November 6th. I advised she continue skin checks regularly. Will continue to follow as needed.

## 2023-01-26 ENCOUNTER — Encounter: Payer: Self-pay | Admitting: Family Medicine

## 2023-01-26 ENCOUNTER — Ambulatory Visit (INDEPENDENT_AMBULATORY_CARE_PROVIDER_SITE_OTHER): Payer: No Typology Code available for payment source | Admitting: Family Medicine

## 2023-01-26 VITALS — BP 100/62 | HR 55 | Temp 97.6°F | Ht 60.5 in | Wt 113.6 lb

## 2023-01-26 DIAGNOSIS — Z1231 Encounter for screening mammogram for malignant neoplasm of breast: Secondary | ICD-10-CM

## 2023-01-26 DIAGNOSIS — Z0000 Encounter for general adult medical examination without abnormal findings: Secondary | ICD-10-CM | POA: Diagnosis not present

## 2023-01-26 DIAGNOSIS — E785 Hyperlipidemia, unspecified: Secondary | ICD-10-CM

## 2023-01-26 NOTE — Progress Notes (Signed)
Complete physical exam  Patient: Makayla King   DOB: [birth year]   57 y.o. Female  MRN: [id number]  Subjective:    Chief Complaint  Patient presents with   Annual Exam    Makayla King is a 57 y.o. female who presents today for a complete physical exam. She reports consuming a general diet.  Plays tennis once a week, twice a week pilates, light weights.  She generally feels well. She reports sleeping well. She does not have additional problems to discuss today.    Most recent fall risk assessment:    06/16/2022    1:54 PM  Fall Risk   Falls in the past year? 0  Number falls in past yr: 0  Injury with Fall? 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 One Source   Omega-3 Fatty Acids (FISH OIL OMEGA-3 PO) Take 1,200 mg by mouth daily.    No facility-administered medications prior to visit.    Review of Systems  HENT:  Negative for hearing loss.   Eyes:  Negative for blurred vision.  Respiratory:  Negative for shortness of breath.   Cardiovascular:  Negative for chest pain.  Gastrointestinal: Negative.   Genitourinary: Negative.   Musculoskeletal:  Negative for back pain.  Neurological:  Negative for  headaches.  Psychiatric/Behavioral:  Negative for depression.   All other systems reviewed and are negative.      Objective:     BP 100/62 (BP Location: Left Arm, Patient Position: Sitting, Cuff Size: Normal)   Pulse (!) 55   Temp 97.6 F (36.4 C) (Oral)   Ht 5' 0.5" (1.537 m)   Wt 113 lb 9.6 oz (51.5 kg)   LMP  (LMP Unknown)   SpO2 100%   BMI 21.82 kg/m    Physical Exam Vitals reviewed.  Constitutional:      Appearance: Normal appearance. She is well-groomed and normal weight.  HENT:     Right Ear: Tympanic membrane normal.     Left Ear: Tympanic membrane normal.     Mouth/Throat:     Mouth: Mucous membranes are moist.     Pharynx: No posterior oropharyngeal erythema.  Eyes:     Conjunctiva/sclera: Conjunctivae normal.  Neck:     Thyroid: No thyromegaly.  Cardiovascular:     Rate and Rhythm: Normal rate and regular rhythm.     Pulses: Normal pulses.     Heart sounds: S1 normal and S2 normal.  Pulmonary:     Effort: Pulmonary effort is normal.     Breath sounds: Normal breath sounds and air entry.  Abdominal:     General: Bowel  sounds are normal.  Musculoskeletal:     Right lower leg: No edema.     Left lower leg: No edema.  Neurological:     Mental Status: She is alert and oriented to person, place, and time. Mental status is at baseline.     Gait: Gait is intact.  Psychiatric:        Mood and Affect: Mood and affect normal.        Speech: Speech normal.        Behavior: Behavior normal.        Judgment: Judgment normal.      No results found for any visits on 01/26/23.     Assessment & Plan:    Routine Health Maintenance and Physical Exam  Immunization History  Administered Date(s) Administered   Moderna Sars-Covid-2 Vaccination 12/16/2019, 01/15/2020   Tdap 07/13/2020   Zoster Recombinat (Shingrix) 01/18/2021, 05/15/2021    Health Maintenance  Topic Date Due   COVID-19 Vaccine (3 - Moderna risk series) 01/26/2024 (Originally 02/12/2020)    INFLUENZA VACCINE  04/09/2023   MAMMOGRAM  12/26/2023   PAP SMEAR-Modifier  07/13/2025   COLONOSCOPY (Pts 45-82yrs Insurance coverage will need to be confirmed)  10/26/2028   DTaP/Tdap/Td (2 - Td or Tdap) 07/13/2030   Hepatitis C Screening  Completed   HIV Screening  Completed   Zoster Vaccines- Shingrix  Completed   HPV VACCINES  Aged Out    Discussed health benefits of physical activity, and encouraged her to engage in regular exercise appropriate for her age and condition.  Hyperlipidemia, unspecified hyperlipidemia type -     Lipid panel; Future -     Comprehensive metabolic panel  Breast cancer screening by mammogram -     3D Screening Mammogram, Left and Right; Future  Routine general medical examination at a health care facility  Normal physical exam findings today, handouts given on healthy eating an exercise, which she is already doing. Mammogram ordered   Return in about 1 year (around 01/26/2024) for annual physical exam.     Karie Georges, MD

## 2023-01-27 LAB — LIPID PANEL
Cholesterol: 261 mg/dL — ABNORMAL HIGH (ref 0–200)
HDL: 83.4 mg/dL (ref >39.00)
LDL Cholesterol: 164 mg/dL — ABNORMAL HIGH (ref 0–99)
NonHDL: 177.41
Total CHOL/HDL Ratio: 3
Triglycerides: 68 mg/dL (ref 0.0–149.0)
VLDL: 13.6 mg/dL (ref 0.0–40.0)

## 2023-01-27 LAB — COMPREHENSIVE METABOLIC PANEL
ALT: 22 U/L (ref 0–35)
AST: 22 U/L (ref 0–37)
Albumin: 4.7 g/dL (ref 3.5–5.2)
Alkaline Phosphatase: 49 U/L (ref 39–117)
BUN: 16 mg/dL (ref 6–23)
CO2: 23 mEq/L (ref 19–32)
Calcium: 9.7 mg/dL (ref 8.4–10.5)
Chloride: 100 mEq/L (ref 96–112)
Creatinine, Ser: 0.72 mg/dL (ref 0.40–1.20)
GFR: 93.59 mL/min (ref >60.00)
Glucose, Bld: 61 mg/dL — ABNORMAL LOW (ref 70–99)
Potassium: 4.2 mEq/L (ref 3.5–5.1)
Sodium: 135 mEq/L (ref 135–145)
Total Bilirubin: 0.7 mg/dL (ref 0.2–1.2)
Total Protein: 7.6 g/dL (ref 6.0–8.3)

## 2023-02-18 ENCOUNTER — Ambulatory Visit
Admission: RE | Admit: 2023-02-18 | Discharge: 2023-02-18 | Disposition: A | Discharge status: Home / Self Care | Payer: No Typology Code available for payment source | Source: Ambulatory Visit | Attending: Family Medicine | Admitting: Family Medicine

## 2023-02-18 DIAGNOSIS — Z1231 Encounter for screening mammogram for malignant neoplasm of breast: Secondary | ICD-10-CM

## 2023-03-02 ENCOUNTER — Encounter: Payer: No Typology Code available for payment source | Admitting: Family Medicine

## 2024-03-01 ENCOUNTER — Encounter: Payer: Self-pay | Admitting: Family Medicine

## 2024-03-01 ENCOUNTER — Ambulatory Visit (INDEPENDENT_AMBULATORY_CARE_PROVIDER_SITE_OTHER): Payer: No Typology Code available for payment source | Admitting: Family Medicine

## 2024-03-01 VITALS — BP 96/60 | HR 65 | Temp 98.2°F | Ht 60.75 in | Wt 112.7 lb

## 2024-03-01 DIAGNOSIS — E782 Mixed hyperlipidemia: Secondary | ICD-10-CM | POA: Diagnosis not present

## 2024-03-01 DIAGNOSIS — E559 Vitamin D deficiency, unspecified: Secondary | ICD-10-CM

## 2024-03-01 DIAGNOSIS — Z0000 Encounter for general adult medical examination without abnormal findings: Secondary | ICD-10-CM | POA: Diagnosis not present

## 2024-03-01 NOTE — Progress Notes (Unsigned)
 Complete physical exam  Patient: Makayla King   DOB: Apr 23, 1966   58 y.o. Female  MRN: [id number]  Subjective:    Chief Complaint  Patient presents with   Annual Exam    Makayla King is a 58 y.o. female who presents today for a complete physical exam. She reports consuming a low sugar, high protein diet. Focuses on salad and berries. Home exercise routine includes walking 3 hrs per week. Plays tennis once a week, pilates twice a week. She generally feels well. She reports sleeping well. She does not have additional problems to discuss today.   Patient is taking B complex vitamins, also takes turmeric but not regularly. Takes magnesium every night, also will take Omega 3-fatty acid supplements  Most recent fall risk assessment:    06/16/2022    1:54 PM  Fall Risk   Falls in the past year? 0  Number falls in past yr: 0  Injury with Fall? 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   Omega-3 Fatty Acids (FISH OIL OMEGA-3 PO) Take 1,200 mg by mouth daily.    [DISCONTINUED] Multiple Vitamins-Minerals (MULTI VITAMIN/MINERALS) TABS Take 1 tablet by mouth daily. One  Source   No facility-administered medications prior to visit.    Review of Systems  HENT:  Negative for hearing loss.   Eyes:  Negative for blurred vision.  Respiratory:  Negative for shortness of breath.   Cardiovascular:  Negative for chest pain.  Gastrointestinal: Negative.   Genitourinary: Negative.   Musculoskeletal:  Negative for back pain.  Neurological:  Negative for headaches.  Psychiatric/Behavioral:  Negative for depression.        Objective:     BP 96/60   Pulse 65   Temp 98.2 F (36.8 C) (Oral)   Ht 5' 0.75 (1.543 m)   Wt 112 lb 11.2 oz (51.1 kg)   LMP  (LMP Unknown)   SpO2 100%   BMI 21.47 kg/m  {Vitals History (Optional):23777}  Physical Exam Vitals reviewed.  Constitutional:      Appearance: Normal appearance. She is well-groomed and normal weight.  HENT:     Right Ear: Tympanic membrane and ear canal normal.     Left Ear: Tympanic membrane and ear canal normal.     Mouth/Throat:     Mouth: Mucous membranes are moist.     Pharynx: No posterior oropharyngeal erythema.   Eyes:     Conjunctiva/sclera: Conjunctivae normal.   Neck:     Thyroid : No thyromegaly.   Cardiovascular:  Rate and Rhythm: Normal rate and regular rhythm.     Pulses: Normal pulses.     Heart sounds: S1 normal and S2 normal.  Pulmonary:     Effort: Pulmonary effort is normal.     Breath sounds: Normal breath sounds and air entry.  Abdominal:     General: Abdomen is flat. Bowel sounds are normal.     Palpations: Abdomen is soft.   Musculoskeletal:     Right lower leg: No edema.     Left lower leg: No edema.  Lymphadenopathy:     Cervical: No cervical adenopathy.   Neurological:     Mental Status: She is alert and oriented to person, place, and time. Mental status is at baseline.     Gait: Gait is intact.   Psychiatric:        Mood and Affect: Mood and affect normal.        Speech: Speech normal.        Behavior: Behavior normal.        Judgment: Judgment  normal.      No results found for any visits on 03/01/24. {Show previous labs (optional):23779}    Assessment & Plan:    Routine Health Maintenance and Physical Exam  Immunization History  Administered Date(s) Administered   Moderna Sars-Covid-2 Vaccination 12/16/2019, 01/15/2020   Tdap 07/13/2020   Zoster Recombinant(Shingrix) 01/18/2021, 05/15/2021    Health Maintenance  Topic Date Due   Hepatitis B Vaccines (1 of 3 - 19+ 3-dose series) Never done   COVID-19 Vaccine (3 - Moderna risk series) 02/12/2020   INFLUENZA VACCINE  04/08/2024   MAMMOGRAM  02/17/2025   Cervical Cancer Screening (HPV/Pap Cotest)  07/13/2025   Colonoscopy  10/26/2028   DTaP/Tdap/Td (2 - Td or Tdap) 07/13/2030   Hepatitis C Screening  Completed   HIV Screening  Completed   Zoster Vaccines- Shingrix  Completed   HPV VACCINES  Aged Out   Meningococcal B Vaccine  Aged Out    Discussed health benefits of physical activity, and encouraged her to engage in regular exercise appropriate for her age and condition.  Mixed hyperlipidemia -     Lipid panel; Future  Routine general medical examination at a health care facility -     Comprehensive metabolic panel with GFR; Future -     CBC with Differential/Platelet; Future  Vitamin D deficiency -     VITAMIN D 25 Hydroxy (Vit-D Deficiency, Fractures); Future    Return in 1 year (on 03/01/2025).     Heron CHRISTELLA Sharper, MD

## 2024-03-01 NOTE — Patient Instructions (Signed)

## 2024-03-02 LAB — COMPREHENSIVE METABOLIC PANEL WITH GFR
ALT: 25 U/L (ref 0–35)
AST: 24 U/L (ref 0–37)
Albumin: 4.6 g/dL (ref 3.5–5.2)
Alkaline Phosphatase: 53 U/L (ref 39–117)
BUN: 15 mg/dL (ref 6–23)
CO2: 26 meq/L (ref 19–32)
Calcium: 9.4 mg/dL (ref 8.4–10.5)
Chloride: 103 meq/L (ref 96–112)
Creatinine, Ser: 0.82 mg/dL (ref 0.40–1.20)
GFR: 79.45 mL/min (ref >60.00)
Glucose, Bld: 66 mg/dL — ABNORMAL LOW (ref 70–99)
Potassium: 3.9 meq/L (ref 3.5–5.1)
Sodium: 139 meq/L (ref 135–145)
Total Bilirubin: 0.8 mg/dL (ref 0.2–1.2)
Total Protein: 7.4 g/dL (ref 6.0–8.3)

## 2024-03-02 LAB — CBC WITH DIFFERENTIAL/PLATELET
Basophils Absolute: 0.1 10*3/uL (ref 0.0–0.1)
Basophils Relative: 1.3 % (ref 0.0–3.0)
Eosinophils Absolute: 0.1 10*3/uL (ref 0.0–0.7)
Eosinophils Relative: 2.5 % (ref 0.0–5.0)
HCT: 41.6 % (ref 36.0–46.0)
Hemoglobin: 14 g/dL (ref 12.0–15.0)
Lymphocytes Relative: 37.9 % (ref 12.0–46.0)
Lymphs Abs: 1.7 10*3/uL (ref 0.7–4.0)
MCHC: 33.7 g/dL (ref 30.0–36.0)
MCV: 88.5 fl (ref 78.0–100.0)
Monocytes Absolute: 0.3 10*3/uL (ref 0.1–1.0)
Monocytes Relative: 5.8 % (ref 3.0–12.0)
Neutro Abs: 2.4 10*3/uL (ref 1.4–7.7)
Neutrophils Relative %: 52.5 % (ref 43.0–77.0)
Platelets: 232 10*3/uL (ref 150.0–400.0)
RBC: 4.7 Mil/uL (ref 3.87–5.11)
RDW: 13.2 % (ref 11.5–15.5)
WBC: 4.5 10*3/uL (ref 4.0–10.5)

## 2024-03-02 LAB — LIPID PANEL
Cholesterol: 246 mg/dL — ABNORMAL HIGH (ref 0–200)
HDL: 88.6 mg/dL (ref >39.00)
LDL Cholesterol: 145 mg/dL — ABNORMAL HIGH (ref 0–99)
NonHDL: 156.95
Total CHOL/HDL Ratio: 3
Triglycerides: 59 mg/dL (ref 0.0–149.0)
VLDL: 11.8 mg/dL (ref 0.0–40.0)

## 2024-03-02 LAB — VITAMIN D 25 HYDROXY (VIT D DEFICIENCY, FRACTURES): VITD: 35.28 ng/mL (ref 30.00–100.00)

## 2024-03-07 ENCOUNTER — Ambulatory Visit: Payer: Self-pay | Admitting: Family Medicine

## 2025-03-02 ENCOUNTER — Encounter: Admitting: Family Medicine
